# Patient Record
Sex: Female | Born: 1965 | Race: Black or African American | Hispanic: No | Marital: Single
Health system: Southern US, Community
[De-identification: ages and names within clinical notes are randomized; demographics above are authoritative.]

---

## 2004-07-04 ENCOUNTER — Other Ambulatory Visit: Payer: Self-pay

## 2004-07-04 ENCOUNTER — Inpatient Hospital Stay: Payer: Self-pay | Admitting: Internal Medicine

## 2004-07-05 ENCOUNTER — Other Ambulatory Visit: Payer: Self-pay

## 2004-07-07 ENCOUNTER — Other Ambulatory Visit: Payer: Self-pay

## 2004-07-08 ENCOUNTER — Other Ambulatory Visit: Payer: Self-pay

## 2004-07-09 ENCOUNTER — Other Ambulatory Visit: Payer: Self-pay

## 2004-07-11 ENCOUNTER — Emergency Department: Payer: Self-pay | Admitting: Emergency Medicine

## 2006-04-12 ENCOUNTER — Inpatient Hospital Stay: Payer: Self-pay | Admitting: Internal Medicine

## 2006-04-12 ENCOUNTER — Other Ambulatory Visit: Payer: Self-pay

## 2006-04-13 ENCOUNTER — Other Ambulatory Visit: Payer: Self-pay

## 2006-04-14 ENCOUNTER — Other Ambulatory Visit: Payer: Self-pay

## 2006-05-26 ENCOUNTER — Inpatient Hospital Stay: Payer: Self-pay | Admitting: Internal Medicine

## 2006-05-26 ENCOUNTER — Other Ambulatory Visit: Payer: Self-pay

## 2012-10-10 ENCOUNTER — Inpatient Hospital Stay: Payer: Self-pay | Admitting: Internal Medicine

## 2012-10-10 LAB — DRUG SCREEN, URINE
Amphetamines, Ur Screen: NEGATIVE (ref ?–1000)
Benzodiazepine, Ur Scrn: NEGATIVE (ref ?–200)
Cannabinoid 50 Ng, Ur ~~LOC~~: POSITIVE (ref ?–50)
Opiate, Ur Screen: POSITIVE (ref ?–300)
Phencyclidine (PCP) Ur S: NEGATIVE (ref ?–25)

## 2012-10-10 LAB — COMPREHENSIVE METABOLIC PANEL
Alkaline Phosphatase: 70 U/L (ref 50–136)
Anion Gap: 6 — ABNORMAL LOW (ref 7–16)
Calcium, Total: 9.2 mg/dL (ref 8.5–10.1)
Chloride: 109 mmol/L — ABNORMAL HIGH (ref 98–107)
Co2: 24 mmol/L (ref 21–32)
Creatinine: 0.85 mg/dL (ref 0.60–1.30)
EGFR (African American): >60
EGFR (Non-African Amer.): >60
Glucose: 106 mg/dL — ABNORMAL HIGH (ref 65–99)
Osmolality: 278 (ref 275–301)
Potassium: 4.2 mmol/L (ref 3.5–5.1)
SGOT(AST): 24 U/L (ref 15–37)
SGPT (ALT): 22 U/L (ref 12–78)
Sodium: 139 mmol/L (ref 136–145)
Total Protein: 7.6 g/dL (ref 6.4–8.2)

## 2012-10-10 LAB — URINALYSIS, COMPLETE
Bilirubin,UR: NEGATIVE
Blood: NEGATIVE
Nitrite: NEGATIVE
RBC,UR: 2 /HPF (ref 0–5)
Specific Gravity: 1.015 (ref 1.003–1.030)

## 2012-10-10 LAB — CK TOTAL AND CKMB (NOT AT ARMC)
CK, Total: 95 U/L (ref 21–215)
CK-MB: 1.8 ng/mL (ref 0.5–3.6)

## 2012-10-10 LAB — CBC
MCHC: 33.7 g/dL (ref 32.0–36.0)
Platelet: 170 10*3/uL (ref 150–440)
RDW: 16.6 % — ABNORMAL HIGH (ref 11.5–14.5)
WBC: 8.2 10*3/uL (ref 3.6–11.0)

## 2012-10-10 LAB — APTT: Activated PTT: 32.3 secs (ref 23.6–35.9)

## 2012-10-10 LAB — TROPONIN I: Troponin-I: 0.48 ng/mL — ABNORMAL HIGH

## 2012-10-10 LAB — PHENYTOIN LEVEL, TOTAL: Dilantin: 2.1 ug/mL — ABNORMAL LOW (ref 10.0–20.0)

## 2012-10-10 LAB — PROTIME-INR: Prothrombin Time: 13.1 secs (ref 11.5–14.7)

## 2012-10-10 LAB — HCG, QUANTITATIVE, PREGNANCY: Beta Hcg, Quant.: <1 m[IU]/mL — ABNORMAL LOW

## 2012-10-11 LAB — CBC WITH DIFFERENTIAL/PLATELET
Basophil #: 0.1 10*3/uL (ref 0.0–0.1)
Basophil %: 1.4 %
Eosinophil #: 0.2 10*3/uL (ref 0.0–0.7)
Eosinophil %: 3.3 %
HCT: 37.6 % (ref 35.0–47.0)
HGB: 12.2 g/dL (ref 12.0–16.0)
Lymphocyte %: 17 %
MCH: 26.3 pg (ref 26.0–34.0)
MCV: 81 fL (ref 80–100)
Monocyte %: 6.8 %
Neutrophil #: 5 10*3/uL (ref 1.4–6.5)
RBC: 4.65 10*6/uL (ref 3.80–5.20)
WBC: 7 10*3/uL (ref 3.6–11.0)

## 2012-10-11 LAB — CK TOTAL AND CKMB (NOT AT ARMC)
CK, Total: 81 U/L (ref 21–215)
CK-MB: 2.3 ng/mL (ref 0.5–3.6)

## 2012-10-11 LAB — BASIC METABOLIC PANEL
Anion Gap: 8 (ref 7–16)
BUN: 14 mg/dL (ref 7–18)
Calcium, Total: 9.2 mg/dL (ref 8.5–10.1)
Co2: 21 mmol/L (ref 21–32)
EGFR (African American): >60
Glucose: 109 mg/dL — ABNORMAL HIGH (ref 65–99)
Osmolality: 277 (ref 275–301)
Potassium: 4 mmol/L (ref 3.5–5.1)
Sodium: 138 mmol/L (ref 136–145)

## 2012-10-11 LAB — LIPID PANEL
Cholesterol: 149 mg/dL (ref 0–200)
HDL Cholesterol: 38 mg/dL — ABNORMAL LOW (ref 40–60)

## 2012-10-11 LAB — HEMOGLOBIN A1C: Hemoglobin A1C: 5.6 % (ref 4.2–6.3)

## 2012-10-11 LAB — TROPONIN I: Troponin-I: 0.51 ng/mL — ABNORMAL HIGH

## 2012-10-13 ENCOUNTER — Inpatient Hospital Stay: Payer: Self-pay | Admitting: Student

## 2012-10-13 LAB — PROTIME-INR: Prothrombin Time: 14.2 secs (ref 11.5–14.7)

## 2012-10-13 LAB — COMPREHENSIVE METABOLIC PANEL
Alkaline Phosphatase: 77 U/L (ref 50–136)
Anion Gap: 7 (ref 7–16)
BUN: 8 mg/dL (ref 7–18)
Bilirubin,Total: 0.2 mg/dL (ref 0.2–1.0)
Calcium, Total: 9.3 mg/dL (ref 8.5–10.1)
Co2: 25 mmol/L (ref 21–32)
Creatinine: 0.74 mg/dL (ref 0.60–1.30)
EGFR (African American): >60
Glucose: 91 mg/dL (ref 65–99)
SGOT(AST): 20 U/L (ref 15–37)
SGPT (ALT): 23 U/L (ref 12–78)
Sodium: 139 mmol/L (ref 136–145)

## 2012-10-13 LAB — CBC
HCT: 39.5 % (ref 35.0–47.0)
MCH: 27.1 pg (ref 26.0–34.0)
WBC: 7.7 10*3/uL (ref 3.6–11.0)

## 2012-10-13 LAB — PHENYTOIN LEVEL, TOTAL: Dilantin: 3.2 ug/mL — ABNORMAL LOW (ref 10.0–20.0)

## 2012-10-14 ENCOUNTER — Emergency Department: Payer: Self-pay | Admitting: Emergency Medicine

## 2012-10-14 LAB — COMPREHENSIVE METABOLIC PANEL
Anion Gap: 7 (ref 7–16)
BUN: 11 mg/dL (ref 7–18)
Bilirubin,Total: 0.2 mg/dL (ref 0.2–1.0)
Calcium, Total: 9.4 mg/dL (ref 8.5–10.1)
Chloride: 110 mmol/L — ABNORMAL HIGH (ref 98–107)
Co2: 25 mmol/L (ref 21–32)
Creatinine: 0.65 mg/dL (ref 0.60–1.30)
EGFR (African American): >60
EGFR (Non-African Amer.): >60
Glucose: 110 mg/dL — ABNORMAL HIGH (ref 65–99)
Potassium: 3.7 mmol/L (ref 3.5–5.1)
SGPT (ALT): 21 U/L (ref 12–78)
Total Protein: 7 g/dL (ref 6.4–8.2)

## 2012-10-14 LAB — CBC
HGB: 12.7 g/dL (ref 12.0–16.0)
MCHC: 33.4 g/dL (ref 32.0–36.0)
MCV: 80 fL (ref 80–100)
Platelet: 165 10*3/uL (ref 150–440)
RBC: 4.74 10*6/uL (ref 3.80–5.20)
RDW: 16.3 % — ABNORMAL HIGH (ref 11.5–14.5)

## 2012-10-14 LAB — APTT: Activated PTT: 34.3 secs (ref 23.6–35.9)

## 2012-10-14 LAB — CK TOTAL AND CKMB (NOT AT ARMC): CK-MB: 0.6 ng/mL (ref 0.5–3.6)

## 2012-10-14 LAB — PROTIME-INR
INR: 1
Prothrombin Time: 13.2 secs (ref 11.5–14.7)

## 2013-05-19 ENCOUNTER — Inpatient Hospital Stay: Payer: Self-pay | Admitting: Internal Medicine

## 2013-05-19 LAB — CK TOTAL AND CKMB (NOT AT ARMC)
CK, Total: 31 U/L (ref 21–215)
CK, Total: 29 U/L (ref 21–215)
CK-MB: 1.6 ng/mL (ref 0.5–3.6)
CK-MB: 1.9 ng/mL (ref 0.5–3.6)

## 2013-05-19 LAB — CBC
HCT: 32.7 % — ABNORMAL LOW (ref 35.0–47.0)
HGB: 10.1 g/dL — ABNORMAL LOW (ref 12.0–16.0)
MCH: 23.7 pg — ABNORMAL LOW (ref 26.0–34.0)
MCHC: 31 g/dL — ABNORMAL LOW (ref 32.0–36.0)
Platelet: 157 10*3/uL (ref 150–440)
RBC: 4.28 10*6/uL (ref 3.80–5.20)
WBC: 11.2 10*3/uL — ABNORMAL HIGH (ref 3.6–11.0)

## 2013-05-19 LAB — URINALYSIS, COMPLETE
Ketone: NEGATIVE
Nitrite: NEGATIVE
Ph: 5 (ref 4.5–8.0)
Squamous Epithelial: 1
WBC UR: 11 /HPF (ref 0–5)

## 2013-05-19 LAB — DRUG SCREEN, URINE
Barbiturates, Ur Screen: POSITIVE (ref ?–200)
Benzodiazepine, Ur Scrn: POSITIVE (ref ?–200)
Cannabinoid 50 Ng, Ur ~~LOC~~: POSITIVE (ref ?–50)
Methadone, Ur Screen: NEGATIVE (ref ?–300)
Opiate, Ur Screen: POSITIVE (ref ?–300)
Phencyclidine (PCP) Ur S: NEGATIVE (ref ?–25)
Tricyclic, Ur Screen: NEGATIVE (ref ?–1000)

## 2013-05-19 LAB — BASIC METABOLIC PANEL
BUN: 14 mg/dL (ref 7–18)
Co2: 27 mmol/L (ref 21–32)
Creatinine: 0.87 mg/dL (ref 0.60–1.30)
Sodium: 141 mmol/L (ref 136–145)

## 2013-05-19 LAB — TROPONIN I
Troponin-I: 0.26 ng/mL — ABNORMAL HIGH
Troponin-I: 0.22 ng/mL — ABNORMAL HIGH

## 2013-05-19 LAB — HEMOGLOBIN: HGB: 9.5 g/dL — ABNORMAL LOW (ref 12.0–16.0)

## 2013-05-20 LAB — MAGNESIUM: Magnesium: 1.8 mg/dL

## 2013-05-20 LAB — HEPATIC FUNCTION PANEL A (ARMC)
Albumin: 2.9 g/dL — ABNORMAL LOW (ref 3.4–5.0)
Alkaline Phosphatase: 63 U/L
Bilirubin, Direct: <0.1 mg/dL (ref 0.00–0.20)
Bilirubin,Total: 0.3 mg/dL (ref 0.2–1.0)
SGOT(AST): 15 U/L (ref 15–37)

## 2013-05-20 LAB — LIPID PANEL
Cholesterol: 163 mg/dL (ref 0–200)
HDL Cholesterol: 37 mg/dL — ABNORMAL LOW (ref 40–60)
Ldl Cholesterol, Calc: 87 mg/dL (ref 0–100)
Triglycerides: 196 mg/dL (ref 0–200)

## 2013-05-20 LAB — CBC WITH DIFFERENTIAL/PLATELET
Basophil #: 0 10*3/uL (ref 0.0–0.1)
Basophil %: 0.4 %
HGB: 9.5 g/dL — ABNORMAL LOW (ref 12.0–16.0)
Lymphocyte #: 0.6 10*3/uL — ABNORMAL LOW (ref 1.0–3.6)
Lymphocyte %: 7.3 %
MCHC: 31.6 g/dL — ABNORMAL LOW (ref 32.0–36.0)
Monocyte #: 0.5 x10 3/mm (ref 0.2–0.9)
Monocyte %: 5.4 %
RBC: 3.97 10*6/uL (ref 3.80–5.20)
WBC: 8.4 10*3/uL (ref 3.6–11.0)

## 2013-05-20 LAB — BASIC METABOLIC PANEL
BUN: 11 mg/dL (ref 7–18)
Calcium, Total: 8.3 mg/dL — ABNORMAL LOW (ref 8.5–10.1)
EGFR (Non-African Amer.): >60
Glucose: 118 mg/dL — ABNORMAL HIGH (ref 65–99)
Osmolality: 274 (ref 275–301)
Sodium: 137 mmol/L (ref 136–145)

## 2013-10-25 ENCOUNTER — Observation Stay: Payer: Self-pay | Admitting: Internal Medicine

## 2013-10-25 LAB — URINALYSIS, COMPLETE
BACTERIA: NONE SEEN
Bilirubin,UR: NEGATIVE
GLUCOSE, UR: NEGATIVE mg/dL (ref 0–75)
Ketone: NEGATIVE
Leukocyte Esterase: NEGATIVE
NITRITE: NEGATIVE
Ph: 6 (ref 4.5–8.0)
Protein: NEGATIVE
RBC,UR: 4 /HPF (ref 0–5)
Specific Gravity: 1.024 (ref 1.003–1.030)
Squamous Epithelial: 15
WBC UR: 2 /HPF (ref 0–5)

## 2013-10-25 LAB — CK TOTAL AND CKMB (NOT AT ARMC)
CK, TOTAL: 53 U/L
CK, Total: 49 U/L
CK-MB: 1.4 ng/mL (ref 0.5–3.6)
CK-MB: 1.7 ng/mL (ref 0.5–3.6)

## 2013-10-25 LAB — COMPREHENSIVE METABOLIC PANEL
ALK PHOS: 60 U/L
ALT: 13 U/L (ref 12–78)
ANION GAP: 7 (ref 7–16)
Albumin: 3.3 g/dL — ABNORMAL LOW (ref 3.4–5.0)
BILIRUBIN TOTAL: 0.1 mg/dL — AB (ref 0.2–1.0)
BUN: 15 mg/dL (ref 7–18)
Calcium, Total: 8.6 mg/dL (ref 8.5–10.1)
Chloride: 112 mmol/L — ABNORMAL HIGH (ref 98–107)
Co2: 22 mmol/L (ref 21–32)
Creatinine: 1.09 mg/dL (ref 0.60–1.30)
EGFR (Non-African Amer.): >60
Glucose: 112 mg/dL — ABNORMAL HIGH (ref 65–99)
Osmolality: 283 (ref 275–301)
Potassium: 3.9 mmol/L (ref 3.5–5.1)
SGOT(AST): 23 U/L (ref 15–37)
SODIUM: 141 mmol/L (ref 136–145)
Total Protein: 6.9 g/dL (ref 6.4–8.2)

## 2013-10-25 LAB — CBC
HCT: 34.4 % — ABNORMAL LOW (ref 35.0–47.0)
HGB: 10.7 g/dL — ABNORMAL LOW (ref 12.0–16.0)
MCH: 23.6 pg — ABNORMAL LOW (ref 26.0–34.0)
MCHC: 31 g/dL — AB (ref 32.0–36.0)
MCV: 76 fL — AB (ref 80–100)
Platelet: 162 10*3/uL (ref 150–440)
RBC: 4.52 10*6/uL (ref 3.80–5.20)
RDW: 18.4 % — ABNORMAL HIGH (ref 11.5–14.5)
WBC: 9 10*3/uL (ref 3.6–11.0)

## 2013-10-25 LAB — TROPONIN I
TROPONIN-I: 0.52 ng/mL — AB
Troponin-I: 0.47 ng/mL — ABNORMAL HIGH
Troponin-I: 0.52 ng/mL — ABNORMAL HIGH

## 2013-10-25 LAB — PHENYTOIN LEVEL, TOTAL

## 2013-10-26 LAB — BASIC METABOLIC PANEL
ANION GAP: 5 — AB (ref 7–16)
BUN: 10 mg/dL (ref 7–18)
CALCIUM: 9 mg/dL (ref 8.5–10.1)
CHLORIDE: 113 mmol/L — AB (ref 98–107)
CO2: 25 mmol/L (ref 21–32)
Creatinine: 1.08 mg/dL (ref 0.60–1.30)
GLUCOSE: 85 mg/dL (ref 65–99)
OSMOLALITY: 283 (ref 275–301)
POTASSIUM: 3.8 mmol/L (ref 3.5–5.1)
Sodium: 143 mmol/L (ref 136–145)

## 2013-10-26 LAB — PROTIME-INR
INR: 1.1
Prothrombin Time: 14.3 secs (ref 11.5–14.7)

## 2013-10-26 LAB — CBC
HCT: 32.3 % — ABNORMAL LOW (ref 35.0–47.0)
HGB: 10.1 g/dL — ABNORMAL LOW (ref 12.0–16.0)
MCH: 23.5 pg — ABNORMAL LOW (ref 26.0–34.0)
MCHC: 31.2 g/dL — AB (ref 32.0–36.0)
MCV: 75 fL — AB (ref 80–100)
Platelet: 173 10*3/uL (ref 150–440)
RBC: 4.28 10*6/uL (ref 3.80–5.20)
RDW: 18.7 % — ABNORMAL HIGH (ref 11.5–14.5)
WBC: 8.7 10*3/uL (ref 3.6–11.0)

## 2013-10-26 LAB — TROPONIN I: Troponin-I: 0.53 ng/mL — ABNORMAL HIGH

## 2013-10-26 LAB — CK TOTAL AND CKMB (NOT AT ARMC)
CK, Total: 61 U/L
CK-MB: 1.1 ng/mL (ref 0.5–3.6)

## 2013-10-26 LAB — PRO B NATRIURETIC PEPTIDE: B-TYPE NATIURETIC PEPTID: 814 pg/mL — AB (ref 0–125)

## 2013-10-27 ENCOUNTER — Observation Stay: Payer: Self-pay | Admitting: Internal Medicine

## 2013-10-27 LAB — CK
CK, TOTAL: 67 U/L
CK, TOTAL: 126 U/L

## 2013-10-27 LAB — DRUG SCREEN, URINE
Amphetamines, Ur Screen: NEGATIVE (ref ?–1000)
BARBITURATES, UR SCREEN: NEGATIVE (ref ?–200)
Benzodiazepine, Ur Scrn: NEGATIVE (ref ?–200)
CANNABINOID 50 NG, UR ~~LOC~~: POSITIVE (ref ?–50)
Cocaine Metabolite,Ur ~~LOC~~: NEGATIVE (ref ?–300)
MDMA (ECSTASY) UR SCREEN: NEGATIVE (ref ?–500)
Methadone, Ur Screen: NEGATIVE (ref ?–300)
Opiate, Ur Screen: NEGATIVE (ref ?–300)
PHENCYCLIDINE (PCP) UR S: NEGATIVE (ref ?–25)
Tricyclic, Ur Screen: NEGATIVE (ref ?–1000)

## 2013-10-27 LAB — TROPONIN I
Troponin-I: 0.54 ng/mL — ABNORMAL HIGH
Troponin-I: 0.54 ng/mL — ABNORMAL HIGH

## 2013-10-28 LAB — CBC WITH DIFFERENTIAL/PLATELET
Basophil #: 0 10*3/uL (ref 0.0–0.1)
Basophil %: 0.1 %
Eosinophil #: 0 10*3/uL (ref 0.0–0.7)
Eosinophil %: 0 %
HCT: 34.1 % — AB (ref 35.0–47.0)
HGB: 10.6 g/dL — ABNORMAL LOW (ref 12.0–16.0)
LYMPHS PCT: 6.7 %
Lymphocyte #: 0.9 10*3/uL — ABNORMAL LOW (ref 1.0–3.6)
MCH: 23.3 pg — ABNORMAL LOW (ref 26.0–34.0)
MCHC: 30.9 g/dL — AB (ref 32.0–36.0)
MCV: 75 fL — ABNORMAL LOW (ref 80–100)
MONO ABS: 0.5 x10 3/mm (ref 0.2–0.9)
Monocyte %: 4 %
Neutrophil #: 12.1 10*3/uL — ABNORMAL HIGH (ref 1.4–6.5)
Neutrophil %: 89.2 %
PLATELETS: 198 10*3/uL (ref 150–440)
RBC: 4.53 10*6/uL (ref 3.80–5.20)
RDW: 18.8 % — ABNORMAL HIGH (ref 11.5–14.5)
WBC: 13.5 10*3/uL — AB (ref 3.6–11.0)

## 2013-10-28 LAB — BASIC METABOLIC PANEL
ANION GAP: 11 (ref 7–16)
BUN: 10 mg/dL (ref 7–18)
CREATININE: 0.75 mg/dL (ref 0.60–1.30)
Calcium, Total: 9.6 mg/dL (ref 8.5–10.1)
Chloride: 110 mmol/L — ABNORMAL HIGH (ref 98–107)
Co2: 19 mmol/L — ABNORMAL LOW (ref 21–32)
Glucose: 128 mg/dL — ABNORMAL HIGH (ref 65–99)
Osmolality: 280 (ref 275–301)
POTASSIUM: 4.2 mmol/L (ref 3.5–5.1)
SODIUM: 140 mmol/L (ref 136–145)

## 2013-10-28 LAB — MAGNESIUM: MAGNESIUM: 2 mg/dL

## 2013-10-28 LAB — TROPONIN I: Troponin-I: 0.32 ng/mL — ABNORMAL HIGH

## 2014-09-19 NOTE — H&P (Signed)
PATIENT NAME:  [name] Haynes, KLIMASZEWSKI MR#:  [id number] DATE OF BIRTH:  July 01, 1965  DATE OF ADMISSION:  05/19/2013  REFERRING PHYSICIAN: Lucrezia Europe, MD PRIMARY CARE PHYSICIAN: Seeing nonlocal physician in [place].  PRIMARY CARDIOLOGIST: Seeing Dr. Maple Hudson in [place], [facility]. Seen by Dr. Gwen Pounds once in May of this year.   CHIEF COMPLAINT: Chest pain.    HISTORY OF PRESENT ILLNESS: This is a 49 year old female with significant past medical history of coronary artery disease status post stents, hypertension, diabetes, hyperlipidemia, seizure disorder, secondary to traumatic brain injury, history of gastroesophageal reflux disease, with history of Non-STEMI in the past in May of this year where she was seen by Dr. Gwen Pounds where she had a cardiac catheterization, where she was found to have   spot at the bifurcation of the proximal circumflex and obtuse margin, rather difficult to put a stent. The patient presents today with complaints of chest pain, reports it woke her up from sleep this evening of a sudden onset, pressure-like quality, midsternal, nonradiating. Accompanied by some nausea and mild shortness of breath and dizziness. Denies any sweating, palpitations cough or productive sputum or hemoptysis. The patient received nitro paste which resolved her chest pain. As well, she received  of aspirin in the ED. The patient's EKG did not show any changes from previous EKG in May of this year. The patient's first troponin was positive, but  reviewing her labs it appears to have chronically elevated troponins in the past and actually, this is the lowest level she ever had. The patient was started on heparin drip in the ED. The patient was noticed to have drop in her hemoglobin from 12 in May to 10 today, as well as her MCV was elevated. The patient reports having heavy menstrual periods as she is on aspirin and Plavix and currently is having her period. The patient had Hemoccults done which was  positive, which did show normal color stools. The patient was started on IV Protonix and her heparin drip was stopped. The patient denies any melena, any coffee-ground emesis, any bright red blood per rectum. Hospitalist service was requested to admit the patient for further management and treatment.   PAST MEDICAL HISTORY: 1. Hypertension.  2. Coronary artery disease status post stents, most recent cardiac cath by Dr. Gwen Pounds in May 2014.  3. 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  4. Noncompliance with medication.  5. Hyperlipidemia.  6. Anemia.  7. Obesity.  8. Gastroesophageal reflux disease.  9. Depression and anxiety.   PAST SURGICAL HISTORY: 1. Tubal ligation.  2. Left ovarian cyst removal.   ALLERGIES: ALEVE, ATIVAN, KETOROLAC, PENICILLIN, TRAMADOL AND ZOFRAN.   HOME MEDICATIONS: 1. Aspirin 81 mg daily.  2. Clonidine 0.1 mg daily.  3. Dilantin 200 mg p.o. b.i.d.  4. Hydralazine 50 mg oral 4 times a day.  5. Lipitor 40 mg oral daily.  6. Lisinopril 40 mg oral daily.  7. Metformin 500 mg oral 2 times a day.  8. Sublingual nitroglycerin as needed.  9. Norvasc 5 mg oral daily.  10. Plavix 75 mg oral daily. 11. Xanax 1 mg oral 2 times a day as needed for anxiety.   SOCIAL HISTORY: The patient lives at home. She lives in [place] visiting friends in [place]. She currently smokes only one cigarette per day. Continues to smoke marijuana occasionally. She stopped using cocaine. Her drug tox is negative for cocaine but positive for marijuana.  Alcohol drinking socially.   FAMILY HISTORY: Significant for  congestive heart failure in her father and coronary artery disease and diabetes in her mother.   REVIEW OF SYSTEMS:  CONSTITUTIONAL: Denies fever, chills, fatigue, weakness, weight gain, weight loss.  EYES: Denies blurry vision, double vision, inflammation, glaucoma.  ENT: Denies tinnitus, ear pain, epistaxis or discharge.  RESPIRATORY: Denies  cough, wheezing, hemoptysis, painful respiratory, COPD.  CARDIOVASCULAR: Complains of chest pain. Denies edema, arrhythmia, palpitations, syncope.  GASTROINTESTINAL: Denies nausea, vomiting, diarrhea, abdominal pain, hematemesis, melena, coffee-ground emesis.  GENITOURINARY: Denies dysuria, hematuria, renal colic.  ENDOCRINE: Denies polyuria, polydipsia, heat or cold intolerance.  HEMATOLOGY: Denies easy bruising, bleeding and _____ . INTEGUMENT: Denies acne, rash or skin lesion.  MUSCULOSKELETAL: Denies any swelling, gout, arthritis, cramps.  NEUROLOGIC: Denies CVA, TIA, headache, ataxia, vertigo. PSYCHIATRIC: Reports history of anxiety and depression. Denies schizophrenia or insomnia.   PHYSICAL EXAMINATION: VITAL SIGNS: Temperature 98.6, pulse 74, respiratory rate 28, blood pressure 179/103, saturating 99% on oxygen.  GENERAL: Obese female who looks comfortable in bed, in no apparent distress.  HEENT: Head atraumatic, normocephalic. Pupils equal, reactive to light. Pink conjunctivae. Anicteric sclerae. Moist oral mucosa.  NECK: Supple. No thyromegaly. No JVD.  CHEST: Good air entry bilaterally. No wheezing, rales or rhonchi.  CARDIOVASCULAR: S1, S2 heard. No rubs, murmur or gallops.  ABDOMEN: Soft, nontender, nondistended. Bowel sounds present. No hepatosplenomegaly.  EXTREMITIES: No edema. No clubbing. No cyanosis. Dorsalis pedis pulse +2 bilaterally.  SKIN: Normal skin turgor. Warm and dry.  LYMPHATICS: No cervical or inguinal lymphadenopathy.  NEUROLOGICAL: Cranial nerves grossly intact. motor   5/5. No focal deficit.  PSYCHIATRIC: The patient is awake, alert x 3, appears to be anxious.   PERTINENT LABORATORY DATA: Glucose 104, BUN 14, creatinine 0.87, sodium 141, potassium 4, chloride 108, CO2 27. Troponin 0.26. Drugs of abuse positive for barbiturates, benzodiazepine, cannabinoids and opiates. White blood cells 11.2, hemoglobin 10.1, hematocrit 32.7, platelets 157, MCV 76, RDW 17.8,  INR 1.1.   EKG showing normal sinus rhythm with old ST and T wave changes, was compared to May of this year.   Chest x-ray. No active cardiopulmonary disease.   ASSESSMENT AND PLAN: 1. Chest pain with elevated troponins. The patient appears to be having chronically elevated troponins, unclear if this is NSTEMI or not, but given her multiple risk factors, as well as her known history of coronary artery disease, the patient was diagnosed with non-ST-elevated myocardial infarction, where she was given aspirin, nitroglycerin and heparin drip, but due to her Hemoccult positive and oral anticoagulation stopped currently. I will hold until she is seen and evaluated by gastroenterology. Meanwhile she will be continued on nitroglycerin, beta blockers,  lisinopril, statins, admitted to telemetry. Continue to cycle her cardiac enzymes.  2. Anemia with Hemoccult-positive stools. Stools are normal in color. The patient having had heavy periods, unclear if her Hemoccult is positive or due to her period or due to mild source of gastrointestinal bleed. The patient will be started on IV Protonix 40 mg p.o. b.i.d. We will consult gastroenterology. She will be kept n.p.o. We will check her H and H every eight hours.  3. Tobacco abuse: The patient was counseled. She only smokes one cigarette per day. Reports she will be able to stop smoking.  4. Anxiety. The patient will be continued on Ativan.  5. Hypertension: Blood pressure uncontrolled. We will continue back on her home medication. If her blood pressure is significantly elevated, but we will be very careful. If it drops we will stop these meds as we  are ruling out gastrointestinal bleed here as well.  6. History of seizures. We will continue her on Dilantin.  7. Hyperlipidemia: Will continue with statin.  8. Anemia, most likely anemia of acute blood loss. Unclear if it is gastrointestinal bleed or due to heavy periods,  being evaluated by gastroenterology.  9.  Gastroesophageal reflux disease. Continue with PPI.  10. Depression and anxiety. Continue with home meds.  11. Deep vein thrombosis prophylaxis. The patient will be kept on sequential compression device pending further evaluation by gastroenterology and cardiology  12. CODE STATUS: The patient reports he is a full code.   TOTAL TIME SPENT ON ADMISSION AND PATIENT CARE: 60 minutes.    ____________________________ Starleen Armsawood S. Elgergawy, MD dse:sg D: 05/19/2013 07:50:00 ET T: 05/19/2013 08:13:43 ET JOB#: [id number]  cc: Starleen Armsawood S. Elgergawy, MD, <Dictator> DAWOOD Teena IraniS ELGERGAWY MD ELECTRONICALLY SIGNED 05/22/2013 5:42

## 2014-09-19 NOTE — Consult Note (Signed)
Brief Consult Note: Diagnosis: [place] Angina CAD.   Patient was seen by consultant.   Consult note dictated.   Recommend further assessment or treatment.   Orders entered.   Discussed with Attending MD.   Comments: IMP [place] CAD HTN Obesity Hyperlipidemia Anxiety Sz GERD Anemia . PLAN ROMI Tele Hold off on anticoug because of anemia GERD Proceed with Myoview in am If Myoview ok will clear for EGD/Scope Advise to quit smoking Statins Protonix.  Electronic Signatures: Dorothyann Pengallwood, Denetta Fei D (MD)  (Signed 22-Dec-14 00:39)  Authored: Brief Consult Note   Last Updated: 22-Dec-14 00:39 by Alwyn Peaallwood, Kylar Speelman D (MD)

## 2014-09-19 NOTE — Consult Note (Signed)
General Aspect 49 year old [place] American female with known coronary artery disease with multiple stenting in the past, presented to emergency room with midsternal chest pain radiating to the left arm, unrelieved with 3 nitroglycerin. Troponin levels are elevated. EKG shows normal sinus rhythm with some nonspecific T wave changes. We do not have previous EKG for comparison. She states that she was recently hospitalized in [place] in December of 2013 with similar symptoms and elevated troponin levels, but she states that heart catheterization was not performed at that time.  She states that she thinks she underwent heart catheterization in the following month in [place], [place], and we will obtain these records.  She does have hypertension, diabetes mellitus and hyperlipidemia for which she has been treated in the past, as well as symptoms of congestive heart failure. The patient continues to have ongoing chest discomfort at this time despite treatment with nitroglycerin and morphine. Blood pressure is elevated at 190/107.  Monitor shows sinus rhythm with heart rate 71 bpm.   Physical Exam:  GEN obese, African American female   HEENT pink conjunctivae, PERRL, hearing intact to voice, moist oral mucosa   NECK supple  No masses  trachea midline   RESP normal resp effort  clear BS   CARD Regular rate and rhythm  Normal, S1, S2   ABD denies tenderness  soft  normal BS   EXTR negative cyanosis/clubbing, negative edema, femoral, dorsalis pedis and pedal pulses intact   SKIN normal to palpation, skin turgor good   NEURO cranial nerves intact, follows commands, motor/sensory function intact   PSYCH alert, A+O to time, place, person   Review of Systems:  Subjective/Chief Complaint Chest pain   General: No Complaints   Skin: No Complaints   ENT: No Complaints   Eyes: No Complaints   Neck: No Complaints   Respiratory: No Complaints   Cardiovascular: Chest pain or discomfort    Gastrointestinal: No Complaints   Genitourinary: No Complaints   Vascular: No Complaints   Musculoskeletal: No Complaints   Neurologic: No Complaints   Hematologic: No Complaints   Endocrine: No Complaints   Psychiatric: No Complaints   Medications/Allergies Reviewed Medications/Allergies reviewed  medication allergies include: Ativan, and Naprosyn, Toradol, tramadol, and Zofran   Lab Results:  Hepatic:  14-May-14 10:32   Bilirubin, Total 0.2  Alkaline Phosphatase 70  SGPT (ALT) 22  SGOT (AST) 24  Total Protein, Serum 7.6  Albumin, Serum 3.7  TDMs:  14-May-14 10:32   Dilantin, Serum  2.1 (Result(s) reported on 10 Oct 2012 at 11:11AM.)  Routine Chem:  14-May-14 10:32   B-Type Natriuretic Peptide ([facility])  316 (Result(s) reported on 10 Oct 2012 at 11:01AM.)  Glucose, Serum  106  BUN 14  Creatinine (comp) 0.85  Sodium, Serum 139  Potassium, Serum 4.2  Chloride, Serum  109  CO2, Serum 24  Calcium (Total), Serum 9.2  Osmolality (calc) 278  eGFR (African American) >60  eGFR (Non-African American) >60 (eGFR values <43m/min/1.73 m2 may be an indication of chronic kidney disease (CKD). Calculated eGFR is useful in patients with stable renal function. The eGFR calculation will not be reliable in acutely ill patients when serum creatinine is changing rapidly. It is not useful in  patients on dialysis. The eGFR calculation may not be applicable to patients at the low and high extremes of body sizes, pregnant women, and vegetarians.)  Anion Gap  6  Result Comment TROPONIN - RESULTS VERIFIED BY REPEAT TESTING.  - CALLED RESULT TO CASEY PIERCE  AT 1109  - 10/10/12-DAC  - READ-BACK PROCESS PERFORMED.  Result(s) reported on 10 Oct 2012 at 11:11AM.  Cardiac:  14-May-14 10:32   CK, Total 95  CPK-MB, Serum 2.0 (Result(s) reported on 10 Oct 2012 at 11:01AM.)  Troponin I  0.48 (0.00-0.05 0.05 ng/mL or less: NEGATIVE  Repeat testing in 3-6 hrs  if clinically indicated. >0.05  ng/mL: POTENTIAL  MYOCARDIAL INJURY. Repeat  testing in 3-6 hrs if  clinically indicated. NOTE: An increase or decrease  of 30% or more on serial  testing suggests a  clinically important change)  Routine Coag:  14-May-14 10:32   Prothrombin 13.1  INR 1.0 (INR reference interval applies to patients on anticoagulant therapy. A single INR therapeutic range for coumarins is not optimal for all indications; however, the suggested range for most indications is 2.0 - 3.0. Exceptions to the INR Reference Range may include: Prosthetic heart valves, acute myocardial infarction, prevention of myocardial infarction, and combinations of aspirin and anticoagulant. The need for a higher or lower target INR must be assessed individually. Reference: The Pharmacology and Management of the Vitamin K  antagonists: the seventh ACCP Conference on Antithrombotic and Thrombolytic Therapy. MVHQI.6962 Sept:126 (3suppl): [id number]. A HCT value >55% may artifactually increase the PT.  In one study,  the increase was an average of 25%. Reference:  "Effect on Routine and Special Coagulation Testing Values of Citrate Anticoagulant Adjustment in Patients with High HCT Values." American Journal of Clinical Pathology 2006;126:400-405.)  Activated PTT (APTT) 32.3 (A HCT value >55% may artifactually increase the APTT. In one study, the increase was an average of 19%. Reference: "Effect on Routine and Special Coagulation Testing Values of Citrate Anticoagulant Adjustment in Patients with High HCT Values." American Journal of Clinical Pathology 2006;126:400-405.)  Routine Hem:  14-May-14 10:32   WBC (CBC) 8.2  RBC (CBC)  5.23  Hemoglobin (CBC) 14.2  Hematocrit (CBC) 42.1  Platelet Count (CBC) 170 (Result(s) reported on 10 Oct 2012 at 10:37AM.)  MCV 81  MCH 27.1  MCHC 33.7  RDW  16.6   Radiology Results:  XRay:    14-May-14 09:32, Chest Portable Single View  Chest Portable Single View   REASON FOR EXAM:     Chest pain  COMMENTS:       PROCEDURE: DXR - DXR PORTABLE CHEST SINGLE VIEW  - Oct 10 2012  9:32AM     RESULT: The lungs are clear. The cardiac silhouette and visualized bony   skeleton are unremarkable.    IMPRESSION:      1. Chest radiograph without evidence of acute cardiopulmonary disease.        Verified By: Mikki Santee, M.D., MD    PCN: Unknown  Ketorolac: Hives  Zofran ODT: Hives  Tramadol: Hives  Ativan: Itching  Aleve: Itching   Impression 49 year old [place] American female with acute coronary syndrome   Plan proceed to cardiac catheterization today by Dr. Serafina Royals to define coronary anatomy   Electronic Signatures: Barbette Merino (NP)  (Signed 14-May-14 13:51)  Authored: General Aspect/Present Illness, History and Physical Exam, Review of System, Home Medications, Labs, Radiology, Allergies, Impression/Plan   Last Updated: 14-May-14 13:51 by Barbette Merino (NP)

## 2014-09-19 NOTE — H&P (Signed)
PATIENT NAME:  [name] Haynes, [name] Haynes MR#:  [id number] DATE OF BIRTH:  13-Aug-1965  DATE OF ADMISSION:  10/10/2012  ADMITTING PHYSICIAN: Enid Baasadhika Cadarius Nevares, MD  PRIMARY CARE PHYSICIAN: None local, in [place].   CHIEF COMPLAINT: Chest pain.   HISTORY OF PRESENT ILLNESS: [name] Haynes is a 49 year old, obese African American female with past medical history significant for coronary artery disease, status post stents, hypertension, diabetes, hyperlipidemia, seizure disorder secondary to a traumatic brain injury, when she was young, gastroesophageal reflux disease, who presented to the hospital here in [place], as she was visiting her family here, secondary to chest pain. The patient said her pain started last evening. It was a tight, pressure-like pain radiating down her left arm associated with nausea, diaphoresis and dizziness. She took medications at home trying to see if the pain would get better, but did not improve so presented to the ED today. Her troponin first set was elevated at 0.48, normal CK and CK-MB. The patient denied using any cocaine; however, her urine tox just came positive for cocaine. She will be admitted for a non-ST segment elevation MI.   PAST MEDICAL HISTORY:  1.  Hypertension.  2.  Coronary artery disease, status post stents.  3.  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  4.  Noncompliance with medications.  5.  Hyperlipidemia.  6.  Anemia.  7.  Polysubstance abuse, including cocaine and marijuana.  8.  Obesity.  9.  Gastroesophageal reflux disease.  10.  Depression and anxiety.   PAST SURGICAL HISTORY: Left ovarian cyst removal.   ALLERGIES TO MEDICATIONS: THE PATIENT IS ALLERGIC TO ALEVE, ATIVAN, KETOROLAC, PENICILLIN, TRAMADOL AND ZOFRAN.   HOME MEDICATIONS:  1.  Aspirin 81 mg p.o. daily.  2.  Clonidine 0.1 mg p.o. daily.  3.  Dilantin 200 mg p.o. b.i.d.  4.  Cardizem 360 mg p.o. daily.  5.  Hydralazine 50 mg 4 times a day.  6.  Lipitor 40 mg  p.o. daily.  7.  Lisinopril 40 mg p.o. daily.  8.  Metformin 500 mg p.o. b.i.d.  9.  Sublingual nitroglycerin 0.4 mg every 5 minutes as needed for chest pain.  10.  Norvasc 10 mg p.o. daily.  11.  Plavix 75 mg p.o. daily.  12.  Xanax 1 mg p.o. b.i.d. p.r.n. for anxiety.   SOCIAL HISTORY: Lives at home with a friend. Continues to smoke. She says now she has cut down to 2 cigarettes per day. Continues to smoke marijuana and also does cocaine, though she denies cocaine at this time. Her urine tox is positive for cocaine. Occasional alcohol drinking.   FAMILY HISTORY: Father died early secondary to congestive heart failure in his 5460s. Mom is still alive and has coronary artery disease and also diabetes mellitus.   REVIEW OF SYSTEMS:    CONSTITUTIONAL: No fever, fatigue or weakness.  EYES: Positive for blurred vision. No double vision, glaucoma or cataracts.  EARS, NOSE, THROAT: No tinnitus, ear pain, hearing loss, epistaxis or discharge.  RESPIRATORY: No cough, wheeze, hemoptysis or COPD.  CARDIOVASCULAR: Positive for chest pain. No arrhythmias, syncope or palpitations.  GASTROINTESTINAL: Positive for nausea. No vomiting, diarrhea, abdominal pain, hematemesis or melena.  GENITOURINARY: No hematuria, dysuria, renal calculus, frequency or incontinence.  ENDOCRINE: No polyuria, nocturia, thyroid problems, heat or cold intolerance.  HEMATOLOGIC: Positive for anemia. No easy bruising or bleeding.  SKIN: No acne, rash or lesions.  MUSCULOSKELETAL: Positive for arthritis.  NEUROLOGICAL: No CVA or TIA. Positive for history of seizures.  PSYCHOLOGICAL: History of anxiety and depression. No insomnia.   PHYSICAL EXAMINATION:  VITAL SIGNS: Temperature 98.1 degrees Fahrenheit, pulse 80, respirations 20, blood pressure 190/125, pulse oximetry 99% on room air.  GENERAL: Heavily built, well nourished female lying in bed, not in any acute distress.  HEENT: Normocephalic, atraumatic. Pupils equal, round,  reacting to light. Anicteric sclerae. Extraocular movements intact. Oropharynx clear without erythema, mass or exudates.  NECK: Supple. No thyromegaly, JVD or carotid bruits. No lymphadenopathy.  LUNGS: Moving air bilaterally. No wheeze or crackles. No use of accessory muscles for breathing.  CARDIOVASCULAR: S1, S2, regular rate and rhythm. No murmurs, rubs or gallops.  ABDOMEN: Soft, nontender, obese, nondistended. No hepatosplenomegaly. Normal bowel sounds.  EXTREMITIES: No pedal edema, no clubbing or cyanosis, 2+ dorsalis pedis pulses palpable bilaterally.  SKIN: No acne, rash or lesions.  LYMPHATIC: No cervical or inguinal lymphadenopathy.  NEUROLOGICAL: Cranial nerves intact. No focal motor or sensory deficits.  PSYCHOLOGICAL: The patient is awake, alert, oriented x 3.   LABORATORY DATA: WBC 8.2, hemoglobin 14.3, hematocrit 42.1, platelet count 170.   Sodium 139, potassium 4.2, chloride 109, bicarbonate 24, BUN 14, creatinine 0.85, glucose 106, calcium 9.2.   ALT 22, AST 24, alkaline phosphatase 70, total bilirubin 0.2 and albumin 3.7.   CK 95, CK-MB 2.0. BNP is slightly elevated at 316. Troponin is 0.48. Dilantin level is low at 3.1. INR 1.0, PTT 32.3.   Urine tox screen positive for cocaine, opioids and also marijuana. Urinalysis negative for any infection.   EKG showing normal sinus rhythm and no acute ST-T wave abnormalities. There are T wave inversions actually in leads I, V5 and V6 which are old.   ASSESSMENT AND PLAN: A 49 year old obese female with history of coronary artery disease, status post stents, hypertension, diabetes, seizure disorder and noncompliance to medications. Admitted for non-ST segment elevation myocardial infarction. 1.  Non-ST segment elevation myocardial infarction: Admit to telemetry. First troponin is elevated. Recycle troponins. Cardiology has been consulted. The patient is started on heparin drip. Likely cardiac catheterization later today. Continue on  aspirin, Plavix, statin. Do not beta blocker due to her cocaine use.  2.  Malignant hypertension: Continue home medications of clonidine, Norvasc, lisinopril and Cardizem. Intravenous hydralazine as needed. 3.  Diabetes mellitus: Hold metformin, as cardiac catheterization today. Sliding scale insulin and check HbA1c.  4.  Anxiety: Continue Xanax as needed. 5.  Gastrointestinal and deep vein thrombosis prophylaxis: Protonix and also heparin.  6.  Seizure disorder: Dilantin is subtherapeutic. No seizures lately in the last year. Continue Dilantin at home dose at this time.   CODE STATUS: Full code.   TIME SPENT ON ADMISSION: 50 minutes.   ____________________________ Enid Baas, MD rk:jm D: 10/10/2012 14:08:08 ET T: 10/10/2012 14:44:00 ET JOB#: [id number]  cc: Enid Baas, MD, <Dictator> Enid Baas MD ELECTRONICALLY SIGNED 10/19/2012 12:40

## 2014-09-19 NOTE — Discharge Summary (Signed)
PATIENT NAME:  [name] Haynes, [name] MR#:  [id number] DATE OF BIRTH:  [birth year]  DATE OF ADMISSION:  10/13/2012 DATE OF DISCHARGE:  10/13/2012  The patient was admitted earlier today on 10/13/2012. The patient walked out AGAINST MEDICAL ADVICE on 10/13/2012.   CHIEF COMPLAINT:  1.  Chest pain: Diagnosis at the time of discharge, chest pain, non-ST elevation myocardial infarction versus costochondritis as the patient had reproducible chest pain neck.  2.  Coronary artery disease, status post stents.  3.  Seizure disorder.  4.  Recent admission for non-ST elevation myocardial infarction, status post catheterization showing a possible 80% lesion at the circumflex, a bifurcating lesion.  5.  History of atypical chest pain.  6.  Pulmonary hypertension.  7.  Depression.  8.  Anxiety.  9.  Noncompliance with medications.  10.  Polysubstance abuse, including recent cocaine and marijuana use.  11. Anemia of chronic disease.   MEDICATIONS AT THE TIME OF DISCHARGE: Tylenol 500 mg q.6 hours, amlodipine  10 mg daily, atorvastatin 40 mg daily, clonidine 0.1 mg b.i.d., hydralazine 50 mg q.i.d., Imdur  60 mg daily, lisinopril 40 mg a day, nitroglycerin 2% 1 inch b.i.d., phenytoin 200 mg q.12h., aspirin 81 mg daily, Plavix 75 mg daily, heparin drip, alprazolam p.r.n., Dilaudid p.r.n., promethazine, p.r.n. oxygen.   SIGNIFICANT LABS: BMP within normal limits. LFTs within normal limits. Troponin was 0.34 which was less than 0.51 on May 15th. During the last hospitalization Dilantin level 3.2. WBC, hemoglobin, hematocrit, platelets all within normal limits. INR 1.1.   HPI/HOSPITAL COURSE: For full details of H and P, please see the dictation dictated earlier by me, Dr. Jacques NavyAhmadzia, but briefly this is a 49 year old female with a history of CAD who was just discharged from the hospital a couple of days ago for a non-ST elevation MI who had a cath. The patient also had atypical features of chest pain at that time. She  presented with chest pain, which she described as being worse than prior, associated with the radiation to the left arm and jaw. The patient also stated that the pain was reproducible, and when I palpated the anterior chest she stated that this was exactly the same pain I had, and am having. Furthermore, the troponin was less than previous discharge, and the ST changes seen on previous EKG in V5 and V6 improved.   She was seen by Dr. Juliann Paresallwood, and although started on a heparin drip Dr. Juliann Paresallwood stated this was likely not cardiac, and he looked at the cath himself and she has and good flow on the cath. He did not believe this was cardiac-related. I started the patient on Tylenol for costochondritis, however the patient walked out AGAINST MEDICAL ADVICE knowing the risks of walking out AGAINST MEDICAL ADVICE, including ongoing pains, MI, and death, as workup had not  been finished at this point.   Total time spent on this case, in addition to admission was 35 minutes, talking to the patient, nurse, and Dr. Juliann Paresallwood.   ____________________________ Krystal EatonShayiq Makyra Corprew, MD sa:dm D: 10/13/2012 21:14:07 ET T: 10/14/2012 11:29:32 ET JOB#: [id number]  cc: Krystal EatonShayiq Mireille Lacombe, MD, <Dictator> Krystal EatonSHAYIQ Rainer Mounce MD ELECTRONICALLY SIGNED 10/30/2012 12:28

## 2014-09-19 NOTE — H&P (Signed)
PATIENT NAME:  [name] Haynes, [name] Haynes MR#:  [id number] DATE OF BIRTH:  [birth year]  DATE OF ADMISSION:  10/13/2012  REFERRING PHYSICIAN: Dr. Shaune Pollack.  PRIMARY CARE PHYSICIAN: None currently.  PRIMARY CARDIOLOGIST: Dr. Okey Dupre at [facility].  CHIEF COMPLAINT: Ongoing chest pain.   HISTORY OF PRESENT ILLNESS: The patient is an obese 49 year old African American female with history of CAD, status post several MIs and stenting in the past with a recent non-ST elevation MI, who was just discharged from the hospital on the 15th. The patient did have a cath last admission per cardiology here on the 14th, which showed an 85% stenosis at bifurcation of stent in OM1 to mid circumflex, which was thought to be the culprit lesion in the last non-ST elevation MI. She was discharged at that time by her cardiologist. She was started on a beta blocker. Of note, also her U-tox was positive for cocaine, which she denied and stated that somebody must have laced her marijuana, but she did not knowingly use any  cocaine. At that time of note, the patient also had some atypical chest pain, as the pain was somewhat reproducible. Imdur was added  in addition to beta blocker. The patient stated the pain started at 10:00 a.m. with radiation to the arm and neck with some shortness of breath, nausea and some sweats. She presented to the hospital. Her troponin is positive, but it looks like it is lower than previous discharge on the 15th, which was 0.51 with a negative CK-MB. Today the troponin is 0.34 and hospitalist service was contacted for further evaluation and management.   PAST MEDICAL HISTORY:  1.  History of recent non-ST elevation and cath on May 14, result of which is above.  2.  History of CAD, status post stenting.  3.  Ongoing smoking.  4.  History of atypical chest pain.  5.  History of hypertension.  6.  Seizure disorder.  7.  GERD.  8.  Depression.  9.  Anxiety.  10.  COPD.  11.  Obesity.  12.  History of noncompliance with  medications.  13.  History of polysubstance abuse, including cocaine, marijuana and tobacco.  14.  History of anemia of chronic disease.   ALLERGIES: ALEVE, ATIVAN, KETOROLAC, MORPHINE, PENICILLIN, TRAMADOL, ZOFRAN ODT.   SOCIAL HISTORY: She still smokes 1 to 2 cigarettes a day. She denies cocaine, but  she had a positive U-tox for cocaine last admission. Occasional alcohol.   FAMILY HISTORY: Father had congestive heart failure. Mom had CAD and diabetes.  REVIEW OF SYSTEMS:    CONSTITUTIONAL: The patient has had weight gain of about 10 to 11 pounds in the last month or so. No fevers or weakness.  EYES: Has some blurry vision. No pain or cataracts.  EARS, NOSE, THROAT: No tinnitus, hearing loss or snoring.  RESPIRATORY: No cough or wheezing. Has occasional shortness of breath.  CARDIOVASCULAR: Chest pain as above. No orthopnea. Has a history of CAD and stenting in the past. Has high blood pressure.  GASTROINTESTINAL: Has some nausea without vomiting, diarrhea, abdominal pain or melena or bright red blood per rectum.  GENITOURINARY: Denies dysuria or hematuria.  HEMATOLOGIC AND LYMPHATIC: Denies anemia or easy bruising.  SKIN: Denies rashes.  MUSCULOSKELETAL: Has chronic bursitis of the left shoulder.  NEUROLOGIC: Denies focal weakness or numbness.  PSYCHIATRIC: Has anxiety and depression.   PHYSICAL EXAMINATION: VITAL SIGNS: Temperature on arrival 98.2, pulse rate 93, respiratory rate 18, blood pressure on arrival 162/96, O2 sat 99% on room  air.  GENERAL: The patient is an obese African American female lying in bed in no obvious distress.  HEENT: Normocephalic, atraumatic. Pupils are equal and reactive. Anicteric sclerae. Extraocular muscles intact. Moist mucous membranes.  NECK: Supple. No thyroid tenderness. No cervical lymphadenopathy.  CARDIOVASCULAR: S1, S2, regular rate and rhythm. No murmurs, rubs or gallops.  LUNGS: Clear to auscultation, with decreased breath sounds at the bases  bilaterally without wheezing, rhonchi or rales.  ABDOMEN: Soft, nontender, nondistended. Positive bowel sounds.  CHEST: To palpation of left anterior chest, there are multiple areas of tenderness to palpation. There is no evidence of cellulitis or redness.  EXTREMITIES: No significant lower extremity edema. NEUROLOGIC: Cranial nerves II to XII  grossly intact. Strength is 5/5 in all extremities. Sensation is intact to light touch.  PSYCHIATRIC: Awake, alert, oriented x 3, a little anxious.   LABORATORY, DIAGNOSTIC AND RADIOLOGICAL DATA: LFTs within normal limits. Troponin 0.34, down from 0.51 from May 15. CK-MB and CK total negative. Dilantin level 3.2. Glucose 91, creatinine 0.74, sodium 139, potassium 3.7. WBC 7.7, hemoglobin 13.3, platelets 168. INR 1.1.   Echocardiogram on May 15 showing EF of 55% to 60% with impaired relaxation pattern  of LV diastolic filling, severe increased left ventricular septal thickness, RV size and systolic function normal. Cardiac cath on the 14th showed normal EF, proximal LAD 25% stenosis, proximal circumflex 10% stenosis at the site of prior stent and second lesion. There is 85% stenosis at  bifurcation of  stent in OM1-mid circumflex, 40% stenosis of proximal RCA.   EKG: Sinus rhythm. There are T wave inversions in I and aVL, but the ST depression in V5 and V6 which was on the previous EKG is gone.   X-ray of the chest: Top-normal size of cardiac silhouette, but there is no overt evidence of CHF.   ASSESSMENT AND PLAN: We have a 49 year old obese African American female with history of coronary artery disease, status post stenting and a recent non-ST elevation myocardial infarction a few days ago, status post catheterization 3 days ago with a difficult lesion in the proximal circumflex and patient discharged on the 15th, who presents with chest pain with positive troponin, although downtrending, with typical and atypical symptoms of chest pain. Although she has  known coronary artery disease and stenting and a recent non-ST elevation myocardial infarction, she has significant palpation tenderness to the left anterior chest. As the pain is ongoing, would admit the patient to the hospital for suspected non-ST elevation myocardial infarction, and the possible culprit re-lesion might be the circumflex. Would obtain a cardiology consult, start the patient on heparin, continue the statin, Plavix, aspirin. She did have  recent cocaine on board. Would again hold the beta blocker until cardiology sees her today. Of note, the patient was not discharged on a beta blocker it appears. Would double up the Imdur, start the patient on Dilaudid AS SHE HAS ALLERGY TO MORPHINE, as well as give her oxygen. Her chest pain is better but persistent. I would see what cardiology says in terms of the lesion at the circumflex at this point. Would admit her to telemetry, cycle  troponins. The patient also did have a thickened septum on recent echocardiogram, which should be monitored as well for hypertrophic cardiomyopathy. Would obtain urine toxicology again and admit her to telemetry. Her blood pressure is somewhat elevated. Would resume her outpatient medications, but make clonidine twice daily. We would start her on sliding scale insulin for diabetes and hold the metformin.  We would continue her anxiety medications. For deep vein thrombosis prophylaxis, she will be on heparin drip. We would continue her seizure medications.   TOTAL TIME SPENT: 50 minutes.   CODE STATUS: The patient is full code.   ____________________________ Krystal EatonShayiq Aicia Babinski, MD sa:jm D: 10/13/2012 14:48:08 ET T: 10/13/2012 19:41:51 ET JOB#: [id number]  cc: Krystal EatonShayiq Kealey Kemmer, MD, <Dictator> Krystal EatonSHAYIQ Trayshawn Durkin MD ELECTRONICALLY SIGNED 10/30/2012 12:28

## 2014-09-19 NOTE — Discharge Summary (Signed)
PATIENT NAME:  [name] Haynes, ROOKSTOOL MR#:  [id number] DATE OF BIRTH:  1965-08-30  DATE OF ADMISSION:  10/10/2012 DATE OF DISCHARGE:  10/11/2012  DISCHARGING PHYSICIAN: Enid Baas, MD  PRIMARY CARE PHYSICIAN: Nonlocal in [place]  CONSULTATIONS IN THE HOSPITAL: Cardiology consultation by Dr. Gwen Pounds.   DISCHARGE DIAGNOSES: 1.  Non-ST segment elevation myocardial infarction status post cardiac catheterization with a difficult bifurcating lesion in the circumflex and medical management recommended.  2.  Ongoing atypical chest pain.  3.  Malignant hypertension.  4.  Seizure disorder.  5.  Gastroesophageal reflux disease.  6.  Depression and anxiety.  7.  Obesity.  8.  History of noncompliance with medications.  9.  Polysubstance abuse, including cocaine and marijuana.  10.  Anemia of chronic disease.   DISCHARGE HOME MEDICATIONS:  1.  Percocet 5/325 mg 1 tablet q. 6 hours p.r.n. for pain.  2.  Lisinopril 40 mg p.o. daily.  3.  Clonidine 0.1 mg p.o. daily.  4.  Imdur 30 mg p.o. daily.  5.  Nitroglycerin sublingual 0.4 mg p.r.n. for chest pain.  6.  Phenytoin 200 mg p.o. b.i.d.  7.  Metformin 500 mg p.o. b.i.d.  8.  Lipitor 40 mg p.o. daily.  9.  Aspirin 81 mg p.o. daily.  10.  Plavix 75 mg p.o. daily.  11.  Hydralazine 15 mg p.o. 4 times a day.  12.  Norvasc 10 mg p.o. daily.  13.  Diltiazem 360 mg p.o. daily.  14.  Xanax 1 mg p.o. twice a day as needed for anxiety.   DISCHARGE DIET: Low-sodium diet.   DISCHARGE ACTIVITY: As tolerated.   FOLLOW-UP INSTRUCTIONS: 1.  Follow up with Dr. Gwen Pounds in 1 week.  2.  Home health PT and also nursing.   LABORATORY AND IMAGING STUDIES PRIOR TO DISCHARGE:  1.  WBC 7.0, hemoglobin 12.2, hematocrit 37.6, platelet count is 131.  2.  Sodium 138, potassium 4.0, chloride 109, bicarbonate 21, BUN 14, creatinine 0.93, glucose 109 and calcium of 9.2.  HbA1c is 5.6.  3.  LDL 77, HDL 38, total cholesterol 045, triglycerides 169. Troponin elevated  at 0.51, though CK-MB is within normal limits.  4.  Urinalysis negative for any infection. Urine tox screen positive for cocaine, opioids and also marijuana.  Serum Dilantin level on admission was extremely low at 2.1. INR is 1.0.  5.  Chest x-ray on admission showing no acute cardiopulmonary abnormality.   6.  Cardiac catheterization is revealing normal LV ejection fraction of 65%, stenosis at the bifurcation of stent site of obtuse marginal and proximal circumflex.   BRIEF HOSPITAL COURSE: [name] Haynes is a 49 year old obese African American female with past medical history significant for prior coronary artery disease status post stents, hypertension, diabetes, hyperlipidemia, noncompliance with medications, ongoing cocaine abuse presents to the hospital secondary to chest pain and found to have elevated troponin.    1.  Non- ST segment elevation myocardial infarction:  The patient's urine tox screen was positive for cocaine, though she denies knowingly taking it and feels like her boyfriend might have mixed it with marijuana, which she admits to taking it.  Anyway, she had elevated troponin which was stable in the hospital with no bump in CK-MB.  She has chronic pain in her joints, and some of her chest pain appears very atypical, too, with pleuritic in nature. However, she did get a cardiac catheterization done which showed she has stenosis in a difficult spot at the bifurcation of the proximal circumflex and  also her obtuse marginal and very difficult to put a stent at that spot.  There is 85% stenosis, and since her pain is very atypical, Cardiology recommended medical management by adding Imdur and beta blocker. However, because of her ongoing cocaine use and noncompliance, beta blocker is not being started at this time, so Imdur is being added to her medications, and they are going to follow her up as an outpatient in the next week.  If no improvement in symptoms, then probably she needs to be treated at  a higher tertiary care center with a complicated stent placement at that spot.  The patient ambulated well and did not complain of any pain, though when you ask if she has pain she always says yes.  So, she is being discharged on aspirin, Plavix, statin, Imdur at this time, and beta blocker is not being added due to ongoing cocaine abuse.  2.  Malignant hypertension:  Blood pressure was elevated on admission, and she is restarted back on blood pressure medications with clonidine, lisinopril, hydralazine and Imdur has been added.  She is also on Cardizem and Norvasc.  3.  Diabetes mellitus: She is on metformin and HbA1c is very well controlled.  4.  Seizure disorder:  She has been on phenytoin, no seizure in the last year; but since phenytoin level was low though she says she has been taking her medications regularly, history of noncompliance is present.  She was restarted back on her home dose of phenytoin at this time.   Her course has been otherwise uneventful in the hospital. She ambulated well with the help of the nurse, requiring holding onto the pole, so Physical Therapy was consulted and they recommended home health for the patient; so she is being discharged with home health and a rolling walker.   DISCHARGE CONDITION: Stable.   DISCHARGE DISPOSITION: Home with home health.   TIME SPENT ON DISCHARGE: 45 minutes.  ____________________________ Enid Baasadhika Ladislao Cohenour, MD rk:cb D: 10/11/2012 17:02:32 ET T: 10/11/2012 20:06:59 ET JOB#: [id number]  cc: Enid Baasadhika Mosiah Bastin, MD, <Dictator> Lamar BlinksBruce J. Kowalski, MD Enid BaasADHIKA Dulcey Riederer MD ELECTRONICALLY SIGNED 10/19/2012 12:38

## 2014-09-20 NOTE — Consult Note (Signed)
PATIENT NAME:  [name] Haynes, MONACO MR#:  [id number] DATE OF BIRTH:  [birth year]  DATE OF CONSULTATION:  10/27/2013  REFERRING PHYSICIAN:  Dr. Randol Kern. CONSULTING PHYSICIAN:  Marcina Millard, MD PRIMARY CARE PHYSICIAN: PCP lives in [place]  CARDIOLOGIST: [facility]  CHIEF COMPLAINT: Chest pain.   HISTORY OF PRESENT ILLNESS: The patient is a 49 year old female with apparent history of known coronary artery disease, multiple coronary stents. The patient also has a history of cocaine abuse. She was recently hospitalized to [facility] where she apparently was admitted with non-ST elevation myocardial infarction and underwent balloon angioplasty of restenotic stent without a new stent. The patient reports that she recently smoked marijuana that was laced with cocaine and developed chest pain and was admitted on 10/25/2013. The patient had borderline elevated troponin of 0.52. Of note, the patient apparently has had chronic borderline elevated troponin of similar level during prior admissions. During the hospitalization, her level was unchanged. Apparently, the patient became upset with her nurse and left AGAINST MEDICAL ADVICE only to return yesterday with persistent ongoing chest discomfort. She describes substernal discomfort which occasionally sharp in nature, which can last up to 20 minutes which comes and goes throughout the day. The patient reports that she has had no further cocaine since her last visit. EKG is nondiagnostic.   PAST MEDICAL HISTORY: 1. Coronary artery disease, status post multiple coronary stents with recent balloon angioplasty for in-stent restenosis on 09/22/2013 at [facility].  2. Hypertension.  3. Hyperlipidemia.  4. 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  5. Medical noncompliance.  6. Cocaine abuse.  7. Anemia.  8. Obesity.  9. Gastroesophageal reflux disease.   MEDICATIONS: Aspirin 81 mg daily, clopidogrel 75 mg daily,  lisinopril 40 mg daily, clonidine 0.2 mg b.i.d., Imdur 60 mg daily, Lipitor 40 mg at bedtime, carvedilol 12.5 mg b.i.d., Lasix 10 mg daily, hydralazine 50 mg 4 times a day, clonidine 0.2 mg b.i.d., phenytoin 200 mg q.12h., trazodone 50 mg at bedtime, Xanax 1 mg b.i.d. p.r.n., Protonix 20 mg daily.   SOCIAL HISTORY: The patient reports that her primary residence is in [facility], but she also lives in [place]. She smokes one cigarette per day. She has ongoing marijuana use, but denies cocaine abuse. She drinks alcohol occasionally.   FAMILY HISTORY: Positive for coronary artery disease and congestive heart failure.   REVIEW OF SYSTEMS:  CONSTITUTIONAL: No fever or chills.  EYES: No blurry vision.  EARS: No hearing loss.  RESPIRATORY: No shortness of breath.  CARDIOVASCULAR: Chest pain as described above.  GASTROINTESTINAL: No nausea, vomiting, or diarrhea.  GENITOURINARY: No dysuria or hematuria.  ENDOCRINE: No polyuria or polydipsia.  MUSCULOSKELETAL: No arthralgias or myalgias.  NEUROLOGICAL: No focal muscle weakness or numbness.  PSYCHOLOGICAL: No depression or anxiety.   PHYSICAL EXAMINATION: VITAL SIGNS: Blood pressure 165/100, pulse 80, respirations 18, temperature 97.  HEENT: Pupils equal, reactive to light and accommodation.  NECK: Supple without thyromegaly.  LUNGS: Clear.  HEART: Normal JVP. Normal PMI. Regular rate and rhythm. Normal S1, S2. No appreciable gallop, murmur, or rub.  ABDOMEN: Soft and nontender. Pulses were intact bilaterally.  MUSCULOSKELETAL: Normal muscle tone.  NEUROLOGIC: The patient is alert and oriented x 3. Motor and sensory both grossly intact.   IMPRESSION: This is a 49 year old female with apparent known history of coronary artery disease, status post multiple coronary stents with recent hospitalization with balloon angioplasty of in-stent restenosis with recent hospitalization likely exacerbated by marijuana use laced with cocaine with  borderline  elevated troponin which appears to be of chronic nature. EKG is nondiagnostic.   RECOMMENDATIONS: 1. Agree with overall current therapy.  2. Would defer full dose anticoagulation.  3. Uptitrate carvedilol in the setting of ongoing chest pain and elevated blood pressure.  4. Consider adding ranolazine.  5. Had a lengthy discussion with the patient, at this time we will defer repeat cardiac catheterization, [facility]  6. Further recommendations pending patient's initial clinical course. ____________________________ Marcina MillardAlexander Shemaiah Round, MD ap:sg D: 10/27/2013 09:49:34 ET T: 10/27/2013 11:30:11 ET JOB#: [id number]  cc: Marcina MillardAlexander Baruch Lewers, MD, <Dictator> Marcina MillardALEXANDER Lakeishia Truluck MD ELECTRONICALLY SIGNED 11/12/2013 8:28

## 2014-09-20 NOTE — Discharge Summary (Signed)
PATIENT NAME:  [name] Haynes, [name] Haynes MR#:  [id number] DATE OF BIRTH:  01-03-1966  DATE OF ADMISSION:  10/25/2013 DATE OF DISCHARGE:  10/25/2013  The patient left AGAINST MEDICAL ADVICE.   ACTIVE DIAGNOSES DURING THE HOSPITAL STAY:  1.  Coronary artery disease with chronic elevated troponin and chronic angina.  2.  Hypertension.  3.  Marijuana abuse.  4.  Cocaine abuse.  5.  History of seizures.  6.  Noncompliance with medications.  7.  Hyperlipidemia.  8.  Chronic pain syndrome.  9.  Narcotic seeking behavior.  10.  Anemia.  11.  Obesity.  12.  Gastroesophageal reflux disease.  13.  Depression.  14.  Anxiety.   CONSULTANTS:  1.  Cardiology, who were not able to see the patient.  2.  Dr. Michela PitcherEly with surgery who placed a central line.   IMAGING STUDIES DONE:  Include a chest x-ray which showed cardiomegaly, mild bronchitis.  No acute infiltrate.   ADMITTING HISTORY AND PHYSICAL AND HOSPITAL COURSE:  Please see detailed H and P dictated previously.  In brief, a 49 year old female patient with prior history of CAD, was admitted to the hospitalist service after having found to have chest pain and elevated troponin.   HOSPITAL COURSE:  The patient was continuously complaining of chest pain which seems to be a chronic problem with her chronic angina.  She recently had a [facility] per the patient, had repeat cardiac cath two days later and was told that there was small vessel blockages which could not be fixed and medical management was advised.  Her troponin was stable around 0.47 to 0.52, which seems to be chronically elevated.  The patient was being monitored on a tele floor and echocardiogram was ordered and pending.  The patient was narcotic seeking and requested IV Dilaudid multiple times.  I have advised that the patient was wheezing, her pain was likely from the chronic angina and this needed to be treated with nitro and also from possibly pulmonary cause with  wheezing and acute bronchitis, but the patient refused to take any steroids or nitro and left AMA, AGAINST MEDICAL ADVICE, in spite of advising to wait to get echocardiogram and see cardiologist to see if she needs to the cardiac catheterization.   The patient did try to leave with her central line intact, but this was removed prior to the patient going home AGAINST MEDICAL ADVICE.   Total time spent today on this case was 55 minutes.    ____________________________ Molinda BailiffSrikar R. Cordale Manera, MD srs:ea D: 10/25/2013 16:15:56 ET T: 10/26/2013 02:43:22 ET JOB#: [id number]  cc: Wardell HeathSrikar R. Costella Schwarz, MD, <Dictator> Orie FishermanSRIKAR R Jesscia Imm MD ELECTRONICALLY SIGNED 11/07/2013 16:38

## 2014-09-20 NOTE — H&P (Signed)
PATIENT NAME:  [name] Haynes, [name] Haynes MR#:  [id number] DATE OF BIRTH:  10-May-1966  DATE OF ADMISSION:  10/27/2013  REFERRING PHYSICIAN:  Dr. Minna Antis.  PRIMARY CARE PHYSICIAN:  Seen by PCP at [facility].   PRIMARY CARDIOLOGIST:  The patient has been followed by [facility], but as well she has been evaluated in the past by [facility], Dr. Gwen Pounds.   CHIEF COMPLAINT:  Chest pain.   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  As well, she was admitted for similar complaints with chest pain, she is known to have chronically elevated cardiac enzymes which remained stable, she did not have any new EKG changes, the patient last time reported she did use marijuana laced with cocaine and her previous episode of chest pain was attributed to that, but currently she denies any illicit drug use since her discharge, the patient reports she started to have chest pain with mild shortness of breath, midsternal, radiating to the left arm, 2 hours prior to presentation when she was preparing to go to sleep, the patient as well was found to have uncontrolled blood pressure upon presentation as well, she was given aspirin, and her troponins was found to be elevated at 0.53, which appears to be at her baseline as on discharge on May 29th was 0.5, as well the patient had mild wheezing upon presentation which did improve after receiving nebulizer treatment and one dose of Solu-Medrol.    PAST  MEDICAL HISTORY:  1.  Hypertension. 2.  Coronary artery disease, status post multiple stents.  3.  The patient reports most recent cardiac cath on April 26 done at [facility] with balloon angioplasty of clogged stents.  Medical records were requested from [facility].  4.  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  5.  Noncompliance with medication.  6.  Hyperlipidemia.  7.  Anemia.  8.  Obesity.  9.  Gastroesophageal reflux disease.  10.  Depression and anxiety.   PAST SURGICAL HISTORY: 1.  Tubal ligation.  2.  Left ovarian cyst removal.   ALLERGIES:  ALEVE, ATIVAN, KETOROLAC, PENICILLIN, TRAMADOL AND ZOFRAN.   HOME MEDICATIONS: 1.  Aspirin 81 mg daily.  2.  Lisinopril 40 mg daily.  3.  Clonidine 0.2 mg oral 2 times a day.  4.  Imdur extended release 60 mg daily.  5.  Sublingual nitro as needed.  6.  Phenytoin extended release 200 mg oral every 12 hours.  7.  Trazodone 50 mg at bedtime.  8.  Metformin 500 mg 2 times a day.  9.  Xanax 1 mg 2 times a day as needed.  10.  Plavix 75 mg daily.  11.  Lipitor 40 mg at bedtime.  12.  Coreg 12.5 mg 2 times a day.  13.  Lasix 10 mg daily.  14.  Protonix 20 mg daily.  15.  Hydralazine 50 mg 4 times a day.   SOCIAL HISTORY:  The patient lives at home, currently visiting her daughter in [place].  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   FAMILY HISTORY:  Significant for congestive heart failure and coronary artery disease and diabetes.   REVIEW OF SYSTEMS:  CONSTITUTIONAL:  The patient denies fever, chills, fatigue, weakness, weight gain, weight loss.  EYES:  Denies blurry vision, double vision, inflammation.  EARS, NOSE, THROAT:  Denies tinnitus, ear pain, hearing loss.  RESPIRATORY:  Denies cough, wheezing, hemoptysis, painful respiratory.  Had some  wheezing and shortness of breath upon presentation.  CARDIOVASCULAR:  Reports chest pain.  Denies edema, arrhythmia, palpitations, syncope.  GASTROINTESTINAL:  Denies nausea, vomiting, diarrhea, abdominal pain.  GENITOURINARY:  Denies dysuria, hematuria, or renal colic.  ENDOCRINE:  Denies polyuria, polydipsia, heat or cold intolerance.  HEMATOLOGY:  Denies anemia, easy bruising, bleeding diathesis.  INTEGUMENTARY:  Denies acne, rash or skin lesion.  MUSCULOSKELETAL:  Denies any swelling, gout, arthritis.  NEUROLOGIC:  Denies CVA, TIA, headache, ataxia, vertigo.  PSYCHIATRIC:  Denies anxiety, insomnia, or depression.   PHYSICAL EXAMINATION: VITAL SIGNS:  Temperature 98.3, pulse 68, respiratory rate 19, blood pressure 152/90, saturating 98% on room air.  GENERAL:  Well-nourished female who looks comfortable in bed, in no apparent distress.  HEENT:  Head atraumatic, normocephalic.  Pupils equal, reactive to light.  Pink conjunctivae.  Anicteric sclerae.  Moist oral mucosa.  NECK:  Supple.  No thyromegaly.  No JVD.  No carotid bruits.  CHEST:  Good air entry bilaterally with scattered wheezing.  No rales or rhonchi.  CARDIOVASCULAR:  S1, S2 heard.  No rubs, murmur or gallops.  ABDOMEN:  Soft, nontender, nondistended.  Bowel sounds present.  EXTREMITIES:  No edema.  No clubbing.  No cyanosis.  PSYCHIATRIC:  Appropriate affect.  Awake, alert x 3.  Intact judgment and insight.  NEUROLOGIC:  Cranial nerves grossly intact.  Motor five out of five.  No focal deficits.  SKIN:  Warm and dry.  Normal skin turgor.  MUSCULOSKELETAL:  No joint effusion or erythema.   PERTINENT LABORATORY DATA:  Glucose 85, BUN 10, creatinine 1.08, sodium 143, potassium 3.8, chloride 113, CO2 25.  Troponin 0.53, CK-MB 1.1.  White blood cell 8.7, hemoglobin 10.1, hematocrit 32.3, platelets 173,000.   ASSESSMENT AND PLAN: 1.  Chest pain with chronically elevated troponins, the patient is known to have history of coronary  artery disease, she denies any cocaine use at this time, her troponins are chronically elevated, appears to be at baseline.  She has no EKG changes, the patient received 324 mg of aspirin in the Emergency Department, we will order one dose of subQ Lovenox for her until acute coronary syndrome is ruled out, we will request her records from [facility] to see what is her recent cardiac catheterization report, the patient will be continued on her beta blockers, aspirin, Plavix, statin.  She received one dose of Lovenox, would consult [facility] cardiology as they have seen her in the past to see if there is any further recommendation or work-up indicated.  2.  Hypertension, uncontrolled initially, but cannot be much better controlled.  She will be resumed back on her home medication.  We will add as needed intravenous hydralazine and nitro paste for her as well.  3.  History of seizures.  The patient will be continued on phenytoin. 4.  Hyperlipidemia.  We will continue with statin.  5.  Gastroesophageal reflux  disease.  Continue with proton pump inhibitors.  6.  History of chronic obstructive pulmonary disease.  The patient has some wheezing upon presentation, mild, but no hypoxia.  She was given one dose of Solu-Medrol, the patient will be continued on DuoNebs every four hours and as needed albuterol.  7.  Diabetes mellitus.  We will continue the patient on insulin sliding scale.  We will hold her metformin.  8.  History of coronary artery disease, she will be continued on aspirin, Plavix, statin, lisinopril and Coreg.  9.  Deep vein thrombosis prophylaxis.  The patient received one dose of Lovenox treatment dose.  We will await cardiology evaluation before decision about to continue prophylaxis versus treatment dose. 10.  CODE STATUS:  FULL CODE.   Total time spent on admission and patient care 50 minutes.     ____________________________ Starleen Armsawood S. Elgergawy, MD dse:ea D: 10/27/2013 02:37:55 ET T: 10/27/2013  06:08:37 ET JOB#: [id number]  cc: Starleen Armsawood S. Elgergawy, MD, <Dictator> DAWOOD Teena IraniS ELGERGAWY MD ELECTRONICALLY SIGNED 10/27/2013 20:31

## 2014-09-20 NOTE — Discharge Summary (Signed)
PATIENT NAME:  [name] Haynes, [name] Haynes MR#:  [id number] DATE OF BIRTH:  07-17-1965  DATE OF ADMISSION:  05/19/2013 DATE OF DISCHARGE:  05/21/2013  ADMITTING PHYSICIAN:  Albertine Patricia, MD  DISCHARGING PHYSICIAN: Gladstone Lighter, MD   PRIMARY CARE PHYSICIAN: Currently none.   CONSULTATIONS IN THE HOSPITAL:  1.  GI consultation with Dr. Vira Agar.  2.  Cardiology consultation by Dr. Clayborn Bigness.   DISCHARGE DIAGNOSES: 1.  Musculoskeletal chest pain.  2.  Chronic chest pain.  3.  Chronically elevated troponin.  4.  History of coronary artery disease, status post stents.  5.  Left shoulder bursitis with pain radiating down to left arm and chest.  6.  Radiculopathy. 7.  Hypertension.  8.  Obesity.  9.  Diabetes mellitus.  10.  Gastroesophageal reflux disease.  11.  Chronic anemia.  12.  History of traumatic brain injury and seizure disorder.  13.  Depression and anxiety.   DISCHARGE HOME MEDICATION: 1.  Lasix 10 mg p.o. daily.  2.  Lisinopril 40 mg p.o. daily.  3.  Clonidine 0.1 mg p.o. daily.  4.  Imdur 60 mg p.o. daily.  5.  Nitroglycerin 0.4 mg p.r.n. for chest pain.  6.  Ranexa 500 mg p.o. b.i.d.  7.  Phenytoin 200 mg p.o. q.12 hours.  8.  Trazodone 50 mg p.o. at bedtime.  9.  Metformin 500 mg p.o. b.i.d.  10.  Aspirin 81 mg p.o. daily.  11.  Lipitor 40 mg p.o. at bedtime.  12.  Hydralazine 50 mg 4 times a day.  13.  Protonix 20 mg p.o. daily.  14.  Flexeril 10 mg q.8 hours p.r.n. for muscle pain.  15.  Coreg 12.5 mg p.o. b.i.d.  16.  Xanax 1 mg p.o. b.i.d. p.r.n. for anxiety.  17.  Percocet 10/325 mg 1 tablet q.8 hours p.r.n. for pain.  18.  Prednisone taper showed.   DISCHARGE DIET: Low-sodium diet.   DISCHARGE ACTIVITY: As tolerated.    FOLLOWUP INSTRUCTIONS:  Cardiology followup at [facility] in 1 to 2 weeks. The patient given list for local physicians to find a PCP.   LABS AND IMAGING STUDIES PRIOR TO DISCHARGE:  1.  WBC 8.4, hemoglobin 9.5, hematocrit 30.1,  platelet count 114.  2.  HbA1c is 6.5.  3.  Sodium 137, potassium 4.2, chloride 109, bicarb 23, BUN 11, creatinine 0.88, glucose 118 and calcium of 8.3. Magnesium 1.8.  4.  LDL 87, HDL 37, total cholesterol 163 and triglycerides 196.  5.  ALT 14, AST 15, alk phos 63, total bilirubin 0.3, albumin of 3.9.  6.  Urine tox screen on admission was positive for opiates, marijuana, benzos and also barbiturates.  7.  Urinalysis negative for any infection.   BRIEF HOSPITAL COURSE: Ms. Boltz is a 49 year old obese African American female with past medical history significant for coronary artery disease, status post prior stents and had a recent cardiac catheterization done in May 2014 which showed small vessel disease not amenable for stents. The patient also has hypertension, diabetes, noncompliance with medications; presents to the hospital secondary to chest pain and was admitted because her troponin was elevated.  1.  Chronic chest pain. Could be musculoskeletal, less likely to be unstable angina as it has more musculoskeletal characteristics.  The patient is very anxious, however, she had a stress part of the treadmill test done which did not show any significant EKG changes so it is less likely to be cardiac origin. The patient does have chronically elevated troponin  at 0.24 range and they have stayed the same during the hospital course. THE PATIENT HAS BEEN REQUESTING FOR CONSTANT IV DILAUDID AND IS ALLERGIC TO ALL OTHER PAIN MEDICATIONS. She also mentions when I was talking about possibly her pain coming down her neck based on the characteristics as it was going down her arm it could be radicular pain, she also did mention that she had bursitis in the left shoulder. So, at this point, it could be either pain from the bursitis or cervical radiculopathy causing the left arm radiation so she is being discharged on a prednisone taper and some pain medications and was advised to follow up with cardiology as an  outpatient and also find a PCP. She was also started on Ranexa for her chronic chest pains and unstable angina. All her other home medications are being continued without any changes.  2.  Chronic anemia. Initially, the patient was thought to have acute GI bleed from heme-positive stools and was seen by Dr. Vira Agar; however, she was on her period at this time when the Hemoccult was done. Dr. Vira Agar said that at this time with her chest pain, no acute indication to do EGD. Hemoglobin has been stable around 9. She has known history of hemoglobin at baseline at 10 although the recent few admissions her hemoglobin was showing up as up to 12. She is only being discharged on a PPI at this time, outpatient followup recommended. No Plavix and baby aspirin should be fine per GI. All her other medications are continued. Her course has been otherwise uneventful in the hospital.   DISCHARGE CONDITION: Stable.   DISCHARGE DISPOSITION: Home.   TIME SPENT ON DISCHARGE: 40 minutes.    ____________________________ Gladstone Lighter, MD rk:cs D: 05/21/2013 17:09:00 ET T: 05/21/2013 18:57:07 ET JOB#: [id number]  cc: Gladstone Lighter, MD, <Dictator> Gladstone Lighter MD ELECTRONICALLY SIGNED 06/05/2013 7:29

## 2014-09-20 NOTE — H&P (Signed)
PATIENT NAME:  [name] Haynes, KHURANA MR#:  [id number] DATE OF BIRTH:  1966/01/13  DATE OF ADMISSION:  10/25/2013  REFERRING PHYSICIAN:  Dr. Dorothea Glassman.  PRIMARY CARE PHYSICIAN: Seeing a physician at [facility].   PRIMARY CARDIOLOGIST: Has been seen by [facility] as per patient, but she has been evaluated a few times while in the hospital by [facility] cardiology and Dr. Gwen Pounds.   CHIEF COMPLAINT: Chest pain.   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 The patient was most recently here in December 2014 complaining of chest pain, when she had negative stress test done then.  Has preparation for EGD. The patient presents today complaining of chest pain. Reports it did start 2:00 a.m., pressure-like tightness quality, in the midsternal, radiating to the left arm accompanied by some mild nausea. Denies any sweating, shortness of breath, palpitation. She was given 324 mg of aspirin by EMS. By the time she presented to the ED, she received nitro paste.  Currently, she denies any chest pain. The patient admitted to smoking marijuana which was laced with cocaine. Reports she did not know it was laced with cocaine. After she smoked it, her friends told her that. The patient does not have any EKG changes. She was found to have elevated troponin at 0.47. The patient has chronically elevated troponin with baseline being a 0.22 to 0.51. It was hard to obtain IV access so she had a right IJ inserted by surgery. As well, her blood pressure was very uncontrolled upon presentation with systolic above 200.  Hospitalist requested to admit the patient for further work-up of her chest pain.   PAST MEDICAL HISTORY: 1.  Hypertension. 2.  Coronary artery disease, status post stent. 3.  Cardiac cath most recent  done by Dr. Gwen Pounds in May 2014. Had negative stress test in December 2014.   4.  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  5.  Noncompliance with medication.  6.  Hyperlipidemia.  7.  Anemia.  8.  Obesity.  9.  Gastroesophageal reflux disease.  10.  Depression and anxiety.   PAST SURGICAL HISTORY: 1.  Tubal ligation.  2.  Left ovarian cyst removal.   ALLERGIES: ALEVE, ATIVAN, KETOROLAC, PENICILLIN, TRAMADOL AND ZOFRAN.   HOME MEDICATIONS:  Aspirin 81 mg daily, lisinopril 40 mg daily, clonidine 0.2 mg 2 times a day, Imdur 60 mg daily, nitroglycerin as needed, Phenytoin 200 mg extended release every 12 hour, trazodone 50 mg at bedtime, metformin 500 mg oral 2 times a day, Xanax 1 mg 2 times a day as needed, Plavix 75 mg daily, Lipitor 40 mg at bedtime, Coreg 12.5 mg 2 times a day, Lasix 10 mg oral daily, Protonix 20 mg daily, hydralazine 50 mg oral 4 times a day.   SOCIAL HISTORY: The patient lives at home. Reports she was visiting a friend in [place]. She smokes 1 cigarette per day. Reports she continues to smoke marijuana occasionally.  Reports she stopped using cocaine, but currently she smoked marijuana laced with cocaine. She drinks alcohol occasionally.   FAMILY HISTORY: Significant for congestive heart failure in her father and coronary artery disease and diabetes in the mother.   REVIEW OF SYSTEMS:  GENERAL:  The patient denies fever, chills, fatigue, weakness, weight gain, weight loss.  EYES: Denies blurry vision, double vision, inflammation.  ENT:  Denies tinnitus, ear pain, hearing loss.  RESPIRATORY: Denies any cough, wheezing, hemoptysis, painful respiratory or COPD.  CARDIOVASCULAR: Reports of chest pain. Denies edema, arrhythmia, palpitations, syncope.  GASTROINTESTINAL: Denies nausea or vomiting, diarrhea, abdominal pain.  GENITOURINARY: Denies dysuria, hematuria, renal colic.  ENDOCRINE: Polyuria, polydipsia, heat or cold intolerance.  HEMATOLOGY:  Denies anemia, easy bruising, bleeding diathesis.  INTEGUMENT: Denies acne, rash or skin lesion.  MUSCULOSKELETAL: Denies any swelling, gout, arthritis.  NEUROLOGIC: Denies CVA, TIA, headache, ataxia, vertigo. PSYCHIATRIC:  Denies anxiety, insomnia, or depression.   PHYSICAL EXAMINATION: VITAL SIGNS: Temperature 98.9, pulse 72, respiratory rate 22, blood pressure 217/122, saturating 99% on room air.  GENERAL: Well-nourished female who looks comfortable, in no apparent distress.  HEENT: Head atraumatic, normocephalic. Pupils equal, reactive to light. Pink conjunctivae. Anicteric sclerae. Moist oral mucosa.  NECK: Supple. No thyromegaly. No JVD. Has right IJ line with minimal blood oozing from the site. CHEST: Good air entry bilaterally. No wheezing, rales or rhonchi.  CARDIOVASCULAR: S1, S2 heard. No rubs, murmurs, or gallops.  ABDOMEN: Soft, nontender, nondistended. Bowel sounds present.  EXTREMITIES: No edema. No clubbing. No cyanosis. Pedal and radial pulses felt bilaterally.  PSYCHIATRIC: Appropriate affect. Awake, alert x3. Intact judgment and insight.  NEUROLOGIC: Cranial nerves grossly intact. Motor five out of five. No focal deficits.  MUSCULOSKELETAL: No joint effusion or erythema.  SKIN: Warm and dry.   PERTINENT LABORATORY DATA: Glucose 112, BUN 15, creatinine 1.09, sodium 141, potassium 3.9, chloride 112. Troponin 0.47. Dilantin less than 0.4. White blood cells 9, hemoglobin 10.7, hematocrit 34, platelets 162, urinalysis negative for leukocyte esterase and nitrite.   Portable chest x-ray: Right IJ ending in the distal SVC. No acute cardiopulmonary process seen. Mild cardiomegaly.   ASSESSMENT AND PLAN: 1.  Chest pain with currently elevated troponin. The patient  __________ coronary artery disease. Her troponins are usually elevated. Has no EKG changes. She received 3 to 4 mg of aspirin by EMS. She is already on aspirin.  This chest pain is most likely related to her to cocaine  consumption as her marijuana was laced with cocaine. The patient had negative stress test in December of last year. Will consult Cardiology for further evaluation and recommendation for work-up. We will continue to cycle her cardiac enzymes, follow the trend. Continue her on home medication including aspirin and Plavix, statin. We will hold beta blockers currently. We will continue with angiotensin-converting enzyme inhibitor.  2.  Uncontrolled hypertension.  Unclear if the patient was compliant with her medication or not. We will resume her back on her home medicine but we will hold her beta blockers due to cocaine and we will add IV p.r.n. hydralazine and nitro paste.  If no improvement, we will start her on nitro drip, but I would anticipate significant improvement after she is back on her medications.  3.  History of seizure disorder. Will continue her home meds.  4.  Hyperlipidemia. Continue with statin.  5.  Gastroesophageal reflux disease.  Continue proton pump inhibitor. 6.  Depression and anxiety. Continue with home meds. 7.  Diabetes mellitus. Continue with metformin.  8. History of coronary artery disease.  We will continue her on aspirin, Plavix, statin, lisinopril and when she is stable, will resume her back on beta blockers.   9.  Deep vein thrombosis prophylaxis with subcutaneous heparin.  10.  CODE STATUS: FULL CODE.   TOTAL TIME SPENT ON ADMISSION AND PATIENT CARE: 55 minutes.    ____________________________ Starleen Armsawood S. Ellia Knowlton, MD dse:dp  D: 10/25/2013 07:38:21 ET T: 10/25/2013 08:03:00 ET JOB#: [id number]  cc: Starleen Arms, MD, <Dictator> Tobiah Celestine Teena Irani MD ELECTRONICALLY SIGNED 10/25/2013 23:37

## 2014-09-20 NOTE — Discharge Summary (Signed)
PATIENT NAME:  [name] Haynes Haynes MR#:  [id number] DATE OF BIRTH:  1966-03-25  DATE OF ADMISSION:  10/27/2013 DATE OF DISCHARGE:  10/28/2013  ADMITTING DIAGNOSIS: Chest pain.  DISCHARGE DIAGNOSES: 1. Chest pain of unclear etiology, acute on chronic. 2. Chronically elevated troponin. No acute coronary syndrome.  4. Pericardial effusion on CT scan of chest. 5. Chronic obstructive pulmonary disease exacerbation. 6. Acute bronchitis. 7. Back pain.  8. Malignant essential hypertension.  9. Obesity.   DISCHARGE CONDITION: Stable.   DISCHARGE MEDICATIONS: The patient is to continue: 1. Furosemide 20 mg p.o. daily.  2. Isosorbide mononitrate 60 mg p.o. daily.  3. Phenytoin 200 mg p.o. twice daily.  4. Trazodone 50 mg p.o. at bedtime.  5. Aspirin 81 mg p.o. daily.  6. Lipitor 40 mg p.o. at bedtime.  7. Hydralazine 50 mg 4 times daily.  8. Protonix 20 mg p.o. daily.  9. Xanax 1 mg twice daily as needed.  10. Clonidine 0.2 mg twice daily.  11. Plavix 75 mg p.o. daily. 12. Tylenol 325 mg 2 tablets every 4 hours as needed.  13. Proventil 2 puffs 4 times daily as needed. 14. Oxycodone 2 tablets every 6 hours as needed.  15. Prednisone taper 50 mg p.o. once and then taper by 10 mg every 2 days until stopped.  16. Lisinopril 40 mg p.o. twice daily.  17. Coreg 25 mg p.o. twice daily.  18. Advair Diskus 250/50 at 1 puff twice daily.  19. Tiotropium 1 inhalation once daily.  20. Guaifenesin 600 mg twice daily.  21. Senna 1 tablet twice daily as needed.  22. Docusate sodium 100 mg p.o. twice daily as needed.  23. Azithromycin 250 mg p.o. daily for 4 more days.  24. Tussionex 5 mL twice daily as needed.  The patient was advised not to take metformin until 10/31/2013.   HOME OXYGEN: None.   DIET: Low-salt, low-fat, low-cholesterol, carbohydrate-controlled diet, regular consistency.  ACTIVITY LIMITATIONS: As tolerated.  FOLLOWUP APPOINTMENT: With primary care physician in 2 days after  discharge, Dr. Darrold Haynes in 1 week after discharge.  PRIMARY CARE PHYSICIAN: None known.  CONSULTANTS: Dr. Darrold Haynes, care management, social worker.  RADIOLOGIC STUDIES: Chest x-ray portable single view, 10/25/2013, revealing enlargement of cardiac silhouette, repeated chest x-ray after central line placement 10/25/2013, portable single view revealed right IJ line noted ending in the distal SVC. No acute cardiopulmonary process noted. Mild cardiomegaly was also noted. CT scan of the chest with IV contrast to rule out aortic dissection revealed no acute dissection or pulmonary embolism identified. Pericardial effusion was noted which is questionable related to the patient's current symptomatology.   HISTORY OF PRESENT ILLNESS: The patient is a [age] year old African American female with past medical history significant for history of coronary artery disease, who presents to the hospital with complaints of chest pains. Please refer to Dr. Teena Haynes's admission on 10/27/2013. On arrival to the hospital, the patient's blood pressure was noted to be high at 150s to over 90s. Her temperature was 98.3, pulse was 68, respirations rate was 19. Oxygen saturations were 98% on room air.   PHYSICAL EXAM: Unremarkable.   The patient EKG done on admission, 10/25/2013 revealed normal sinus rhythm at 88 beats per minute, prolonged QTc to 488 milliseconds and no acute ST-T changes were noted.   LABORATORY DATA: The patient's laboratory data done on admission revealed mild elevation of glucose to 112. Otherwise unremarkable BMP. The patient's liver enzymes were remarkable for a low bilirubin, total bilirubin level of 0.1, albumin  level of 3.3, patient's troponin was elevated at 0.47 on the first set, 0.52 on the second, 0.53 on the third one. Urine drug screen was positive for cannabinoids. CBC was remarkable for white blood cell count 9.0, hemoglobin 10.7, platelet count 162,000, with low MCV  level of 76. Coagulation  panel was unremarkable. Urinalysis was remarkable for 4 red blood cells, 2 white blood cells. No bacteria was seen, 15 epithelial cells were noted.   The patient was admitted to the hospital for further evaluation. A consultation with Dr. Darrold Haynes was obtained. Dr. Darrold Haynes felt that patient needs to have cardiac enzymes cycled before full dose of anticoagulation. He felt that her carvedilol should be up titrated in the setting of ongoing chest pains as well as elevated blood pressure and consider adding ranolazine. Also after a lengthy discussion with the patient, he deferred repeated cardiac catheterization and recommended to obtain records from [facility] and further reassessment as outpatient. The patient's cardiac enzymes were cycled and they failed to show any significant elevation. It was felt that patient's mild elevation of troponins was chronic and not representing acute coronary syndrome.  So, the patient continued to complain of chest pains and in view of her markedly elevated blood pressure, there was concern that the patient may have an aortic dissection or even pulmonary embolism. The patient had a CT scan of her chest done with IV contrast, which showed no aortic dissection. However, a pericardial effusion was noted. I discussed the patient's case with Dr. Darrold Haynes again over the phone on the day of discharge 10/28/2013 and he felt that there was no other indication for any other therapy. I also discussed this patient ranolazine therapy and it appeared the patient was given this medication in the past; however, it had to be stopped due to poor tolerance. I initiated patient on oxycodone, her usual doses, and recommended to follow up with her primary physician for further recommendations. I, however, felt the patient's chest pain was not  real and probably representing chronic pain syndrome. The patient was advised to follow up with her primary physician and make decisions about pain  clinic evaluation. On the day of discharge, the patient felt satisfactory, did not complain of any significant discomfort and her vitals were stable with temperature of 98.2, pulse was 78, respiratory rate of 20, blood pressure 157/80, saturation was 100% on room air at rest.   TIME SPENT: 40 minutes.    ____________________________ Katharina Caper, MD rv:lt/am D: 10/28/2013 20:36:05 ET T: 10/29/2013 00:26:18 ET JOB#: [id number]  cc: Katharina Caper, MD, <Dictator> Marcina Millard, MD  Harlean Regula MD ELECTRONICALLY SIGNED 11/12/2013 7:27

## 2014-09-20 NOTE — Op Note (Signed)
PATIENT NAME:  [name] PepperWADE, [name] MR#:  [id number] DATE OF BIRTH:  1965/09/01  DATE OF PROCEDURE:  10/25/2013  PREOPERATIVE DIAGNOSIS: Chest pain with lack of IV access.   POSTOPERATIVE DIAGNOSIS: Chest pain with lack of IV access.   OPERATION: Central line insertion.   ANESTHESIA: Local.   SURGEON: Tiney Rougealph Ely, MD  OPERATIVE PROCEDURE: With the patient in the supine position, the patient's right neck was interrogated with ultrasound. The vein appear to be compressible and patent. The area was then prepped with ChloraPrep and draped with sterile towels. Xylocaine 1% was used to anesthetize the area identified by the ultrasound. Using ultrasound guidance and sterile situation, the vein was cannulated on a single pass and a wire passed into the great vessel with some difficulty. It did not kink or bend but did not move smoothly through the neck. The incision was enlarged and the needle withdrawn. Dilator placed without difficulty. The dilator was removed and the catheter inserted over the wire without difficulty. The catheter was then secured in place with 3-0 silk, appropriately aspirated, flushed and dressed. Chest x-ray is pending.    ____________________________ Carmie Endalph L. Ely III, MD rle:dp D: 10/25/2013 05:34:06 ET T: 10/25/2013 08:32:00 ET JOB#: [id number]  cc: Quentin Orealph L. Ely III, MD, <Dictator> Quentin OreALPH L ELY MD ELECTRONICALLY SIGNED 10/28/2013 9:51

## 2014-09-20 NOTE — Consult Note (Signed)
PATIENT NAME:  [name] Haynes, Shakiera MR#:  [id number] DATE OF BIRTH:  Jan 26, 1966  DATE OF CONSULTATION:  05/19/2013  CONSULTING PHYSICIAN:  Scot Junobert T. Elliott, MD  The patient is a 49 year old black female with a history of heart trouble since age 49 when she was told she had an enlarged heart. She has had a history of coronary artery disease with previous stents, non-STEMI in May 2014, seen by Dr. Gwen PoundsKowalski, had a cardiac catheterization. Had a narrowing of the bifurcation of the proximal circumflex and obtuse margin. Unable to put a stent in that area. She has a history of hypertension, diabetes, hyperlipidemia, seizure disorder,  traumatic brain injury, history of GERD. She has never had an upper endoscopy and never had a colonoscopy.   The patient had a fall where she fell down about 15 steps Friday night. No fractures. She has a good size bruise on the left side of her head. She awoke last night with heavy chest pain. She woke out of her sleep with severe chest pain radiating to her left arm causing dizziness and nausea and she vomited once. The vomit was a "dark water." The family called the rescue squad. They came back. They could not get an IV in her because of poor veins, and gave her 4 aspirin to chew and she was brought to the hospital, and because of an elevated troponin and chest pain was admitted to the hospital. Because of the heme positive stool and some vomiting of dark water I was asked to see her, as well as anemia.   The patient does say she has had heavy periods. She will use in 9 to 10 tampons in a day. Her periods normally runs for about four days. She has had hemoglobin of 10 in the past; however, her, hemoglobin in May was 13.   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   ALLERGIES: NONSTEROIDAL  ANTI-INFLAMMATORY DRUGS CAUSE HIVES. ALSO ALLERGIC TO PENICILLIN, TRAMADOL, ZOFRAN AND ATIVAN.   HOME MEDICATIONS: Aspirin 81 mg a day, clonidine 0.1 mg daily, Dilantin 200 mg b.i.d., hydralazine 50 mg 4 times a day, Lipitor 40 mg a day, lisinopril 40 mg a day, metformin 5 mg 2 times a day, sublingual nitroglycerin p.r.n. Norvasc 5 mg a day, Plavix 75 mg a day, Xanax 1 mg b.i.d. p.r.n. for anxiety.   SOCIAL HISTORY: [place]. Smokes one cigarette a day. She used to be on a pack a day. She has been smoking one cigarette a day for eight months.  Occasional marijuana. The patient previously was positive for cocaine, but on this admission was not.   FAMILY HISTORY: Heart disease in father and heart disease and diabetes in mother. A brother with lymphoma, grandmother died of stomach cancer. A grandfather had ulcer disease, he did  drink.   PHYSICAL EXAMINATION: GENERAL: Obese black female in no acute distress.  HEENT: Sclerae anicteric. Conjunctivae negative. Tongue negative. The head shows a bruise from the left cheek bone.  CHEST: Clear. Decreased breath sounds globally.  HEART: Shows a 1/6 systolic murmur.  ABDOMEN: Obese. No hepatosplenomegaly. No masses. No bruits. No significant tenderness.  EXTREMITIES: No edema.  RECTAL: No perianal disease. No stool to check. No blood on examining finger.   LABORATORY DATA: Glucose 104, BUN 14, creatinine 0.87, sodium 140, potassium 4, chloride 108, CO2 27. Troponin 0.26. Urine positive for barbiturates,  benzodiazepines, opiates and marijuana. White blood count 11.2, hemoglobin 10.1, platelet count 157, MCV 76. EKG shows old ST and T wave changes. Chest x-ray is negative.   ASSESSMENT: Her heme positive stool could be because she has been on her period and has been on her period when this was done. It could be evidence that the aspirin and Plavix are producing slow GI loss. The dark liquid she vomited could have been an indication blood, could  have been related to what she ate or drank. I discussed the case with Dr. Meda Coffee and recommended that at least starting back her baby aspirin would be appropriate. If anticoagulation needed to be done, the best choice would be heparin because this can be easily reversed. At this time, given her own spell of chest pain, her elevated enzymes, I am reluctant to do an upper endoscopy unless the necessity is compelling. Consideration could be made for an upper GI x-ray, which will be much easier and would show significant ulcers,  may not show small ulcers or mucosal inflammation. I do recommend a PPI twice a day and serial hemoglobins. I will follow with you.   ____________________________ Scot Jun, MD rte:sg D: 05/19/2013 12:24:21 ET T: 05/19/2013 12:40:14 ET JOB#: [id number]  cc: Scot Jun, MD, <Dictator>     Scot Jun MD ELECTRONICALLY SIGNED 06/13/2013 17:34

## 2014-12-09 IMAGING — CT CT ANGIO CHEST
3 of 6 series · 16 of 36 positions shown · IV contrast (APPLIED)
Comparison: None.

CLINICAL DATA: Chest pain

EXAM:
CT ANGIOGRAPHY CHEST WITH CONTRAST
TECHNIQUE: Multidetector CT imaging of the chest was performed using the
standard protocol during bolus administration of intravenous
contrast. Multiplanar CT image reconstructions and MIPs were
obtained to evaluate the vascular anatomy.
CONTRAST:  125 mL Isovue 370

[Series 2: routine chest wo · axial · 0.68mm/px · z∈[-473,-243]mm · 7 of 62 slices shown]
[im 8/62  lung]
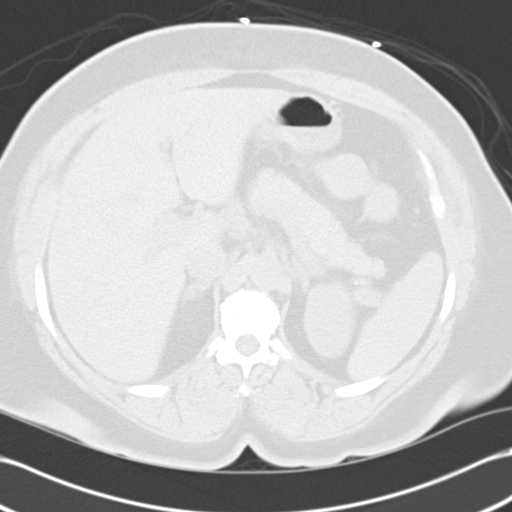
[im 16/62  mediastinal]
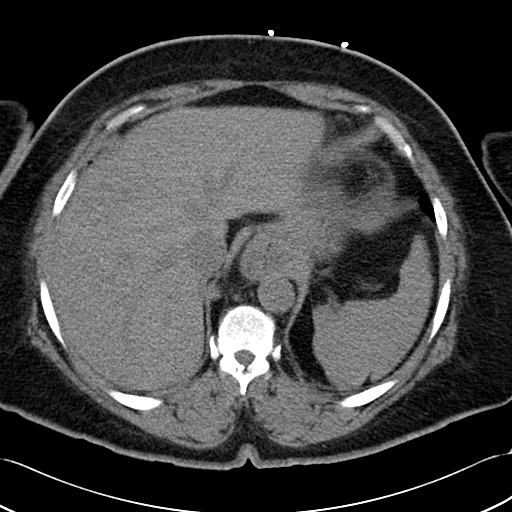
[im 23/62  lung]
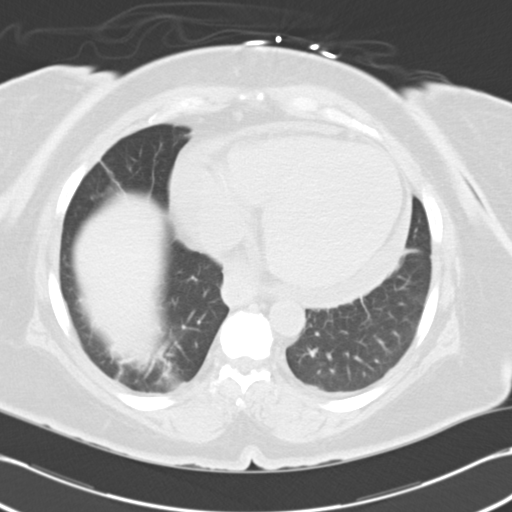
[im 31/62  mediastinal]
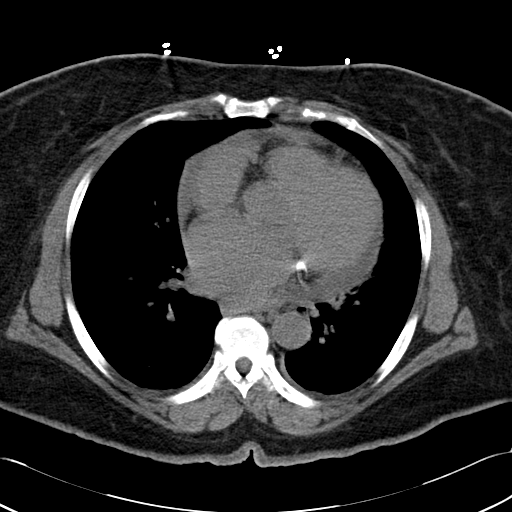
[im 39/62  lung]
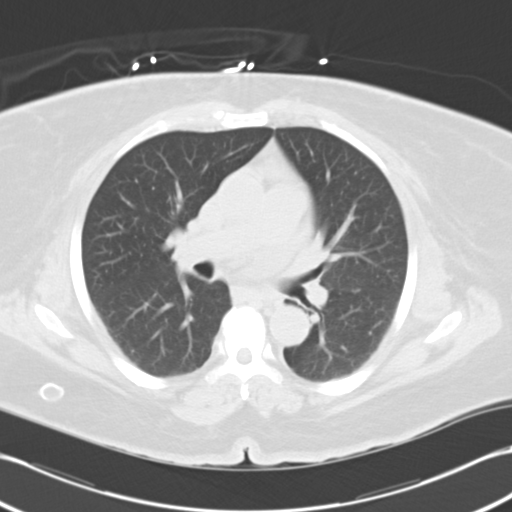
[im 46/62  mediastinal]
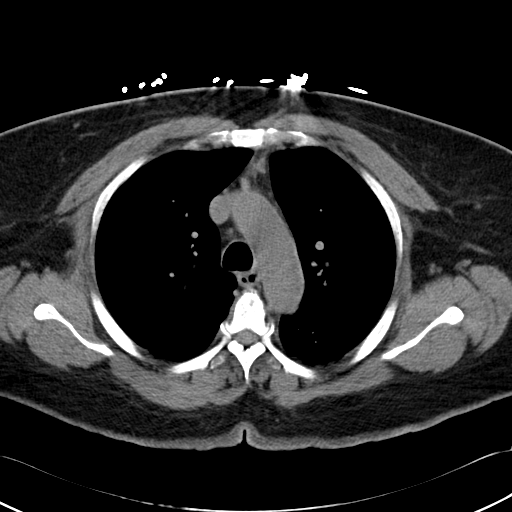
[im 54/62  lung]
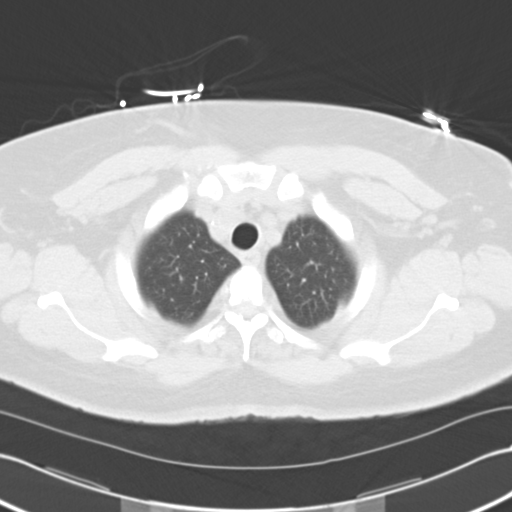

[Series 6: arterial · axial · arterial · 0.68mm/px · z∈[-443,-233]mm · 8 of 136 slices shown]
[im 16/136  lung]
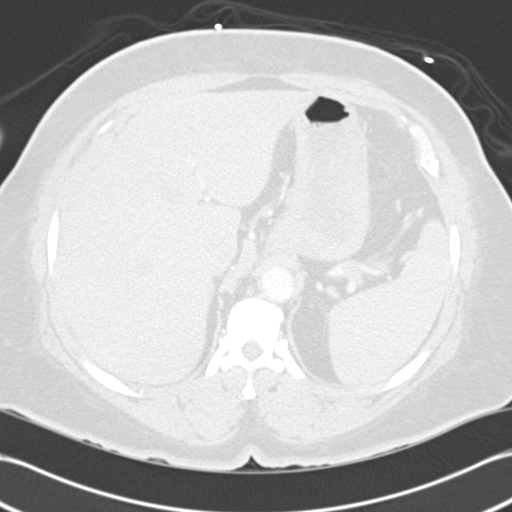
[im 31/136  lung]
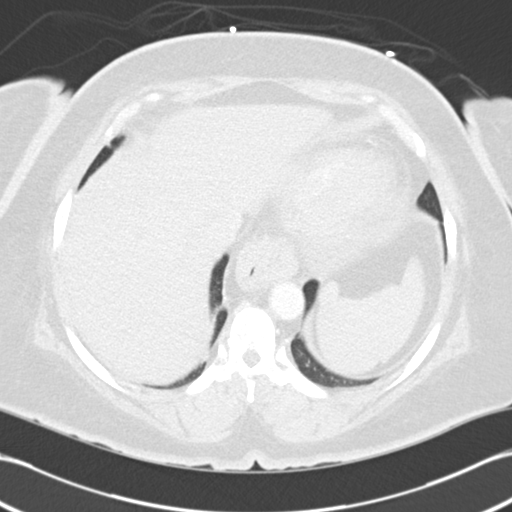
[im 46/136  lung]
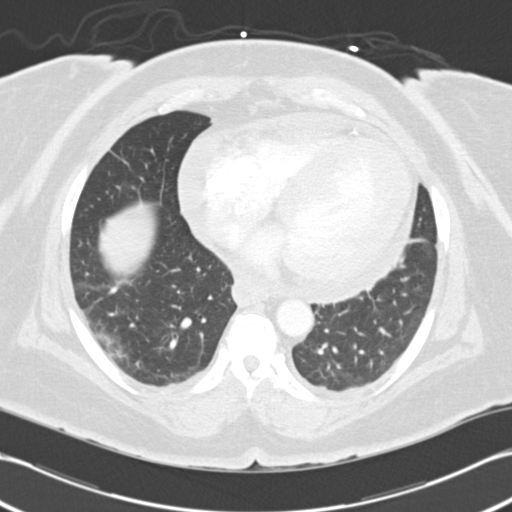
[im 61/136  lung]
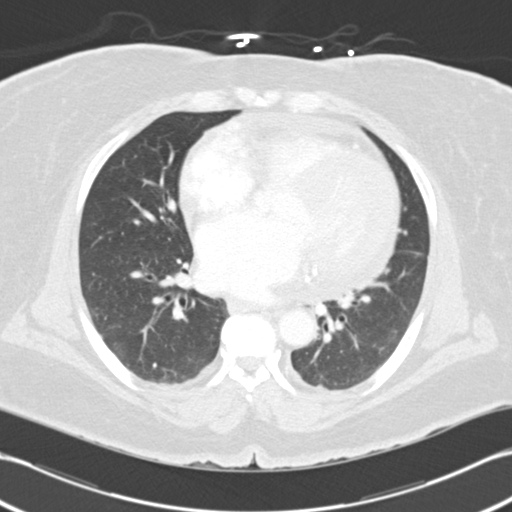
[im 76/136  lung]
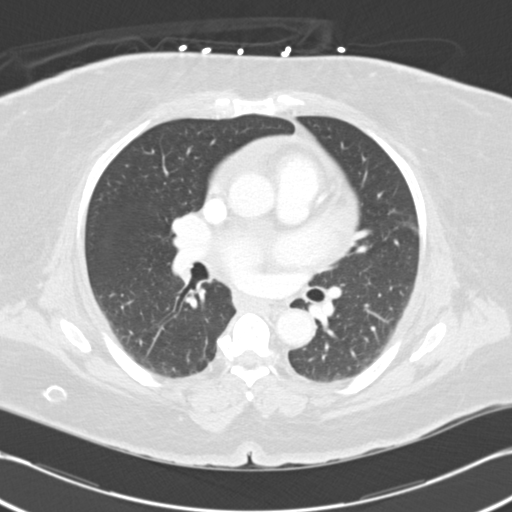
[im 91/136  lung]
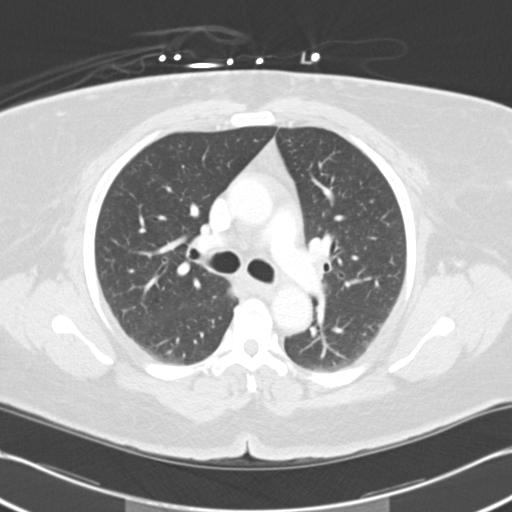
[im 106/136  lung]
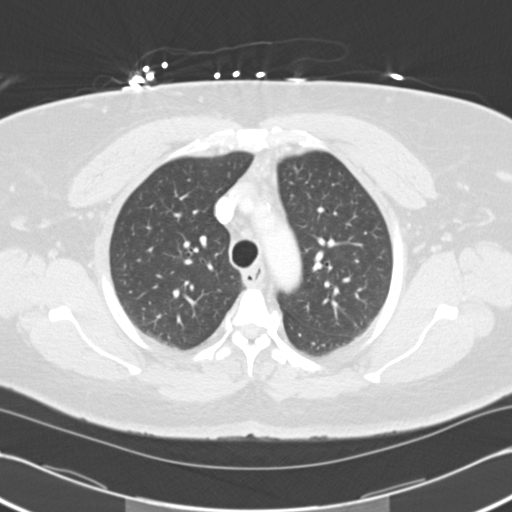
[im 121/136  lung]
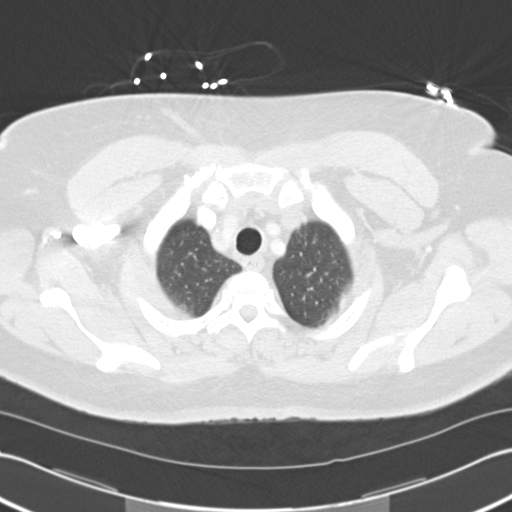

[Series 8: cor arterial mpr · coronal · arterial · 0.83mm/px · 1 of 149 slices shown]
[im 75/149  mediastinal]
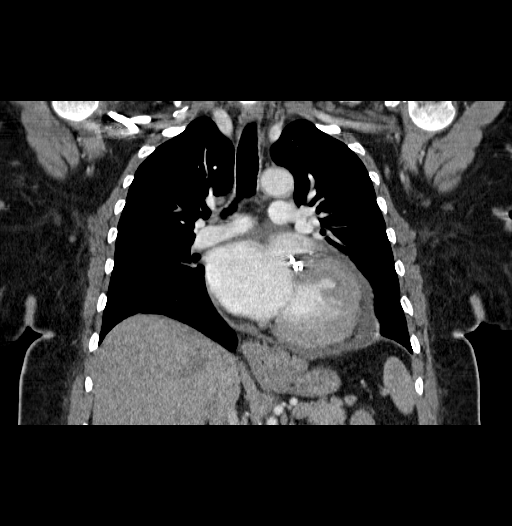

[16 of 36 positions shown; findings below may reference images not displayed]

FINDINGS: The lungs are well aerated bilaterally without focal infiltrate or
sizable effusion. No pneumothorax or parenchymal nodule is seen. The
thoracic inlet is within normal limits. The thoracic aorta is well
visualized and shows no evidence of dissection or aneurysmal
dilatation. The origins of the aortic branches are patent. The
pulmonary artery is incompletely evaluated as the study was time to
more for aortic evaluation. No gross large central pulmonary embolus
is seen. The hilar and mediastinal structures show no significant
lymphadenopathy. Heavy coronary calcification is noted as well as
some coronary stents. A pericardial effusion is noted. It measures
approximately 13 mm posterior to the left ventricle and
approximately 16-17 mm along the right ventricular border.

The visualized upper abdomen reveals no focal abnormality. The
osseous structures show degenerative change of the thoracic spine.

Review of the MIP images confirms the above findings.
IMPRESSION: No aortic dissection or pulmonary embolism is identified.

Pericardial effusion as described. It is possible this is related to
the patient's current symptomatology.

No other focal abnormality is seen.

## 2015-03-31 DEATH — deceased
